# Patient Record
Sex: Female | Born: 1970
Health system: Southern US, Community
[De-identification: ages and names within clinical notes are randomized; demographics above are authoritative.]

## PROBLEM LIST (undated history)

## (undated) DIAGNOSIS — F32A Depression, unspecified: Secondary | ICD-10-CM

## (undated) DIAGNOSIS — N946 Dysmenorrhea, unspecified: Secondary | ICD-10-CM

## (undated) DIAGNOSIS — N979 Female infertility, unspecified: Secondary | ICD-10-CM

## (undated) DIAGNOSIS — C801 Malignant (primary) neoplasm, unspecified: Secondary | ICD-10-CM

## (undated) DIAGNOSIS — E785 Hyperlipidemia, unspecified: Secondary | ICD-10-CM

## (undated) DIAGNOSIS — M810 Age-related osteoporosis without current pathological fracture: Secondary | ICD-10-CM

## (undated) DIAGNOSIS — F329 Major depressive disorder, single episode, unspecified: Secondary | ICD-10-CM

## (undated) HISTORY — DX: Depression, unspecified: F32.A

## (undated) HISTORY — PX: TONSILLECTOMY: SUR1361

## (undated) HISTORY — DX: Major depressive disorder, single episode, unspecified: F32.9

## (undated) HISTORY — DX: Malignant (primary) neoplasm, unspecified: C80.1

## (undated) HISTORY — DX: Dysmenorrhea, unspecified: N94.6

## (undated) HISTORY — DX: Hyperlipidemia, unspecified: E78.5

## (undated) HISTORY — PX: FRACTURE SURGERY: SHX138

## (undated) HISTORY — DX: Female infertility, unspecified: N97.9

## (undated) HISTORY — DX: Age-related osteoporosis without current pathological fracture: M81.0

---
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
## 2004-07-28 ENCOUNTER — Other Ambulatory Visit: Admission: RE | Admit: 2004-07-28 | Discharge: 2004-07-28 | Payer: Self-pay | Admitting: Family Medicine

## 2006-01-28 ENCOUNTER — Emergency Department (HOSPITAL_COMMUNITY): Admission: EM | Admit: 2006-01-28 | Discharge: 2006-01-28 | Payer: Self-pay | Admitting: Emergency Medicine

## 2012-07-18 ENCOUNTER — Other Ambulatory Visit: Payer: Self-pay | Admitting: Obstetrics

## 2012-07-18 DIAGNOSIS — Z1231 Encounter for screening mammogram for malignant neoplasm of breast: Secondary | ICD-10-CM

## 2012-08-08 ENCOUNTER — Ambulatory Visit (HOSPITAL_COMMUNITY)
Admission: RE | Admit: 2012-08-08 | Discharge: 2012-08-08 | Disposition: A | Discharge status: Home / Self Care | Payer: BC Managed Care – PPO | Source: Ambulatory Visit | Attending: Obstetrics | Admitting: Obstetrics

## 2012-08-08 DIAGNOSIS — Z1231 Encounter for screening mammogram for malignant neoplasm of breast: Secondary | ICD-10-CM

## 2013-07-12 ENCOUNTER — Other Ambulatory Visit: Payer: Self-pay | Admitting: Obstetrics

## 2013-07-12 DIAGNOSIS — Z1231 Encounter for screening mammogram for malignant neoplasm of breast: Secondary | ICD-10-CM

## 2013-08-08 ENCOUNTER — Ambulatory Visit (HOSPITAL_COMMUNITY): Payer: BC Managed Care – PPO

## 2013-08-13 ENCOUNTER — Ambulatory Visit (HOSPITAL_COMMUNITY)
Admission: RE | Admit: 2013-08-13 | Discharge: 2013-08-13 | Disposition: A | Discharge status: Home / Self Care | Payer: BC Managed Care – PPO | Source: Ambulatory Visit | Attending: Obstetrics | Admitting: Obstetrics

## 2013-08-13 DIAGNOSIS — Z1231 Encounter for screening mammogram for malignant neoplasm of breast: Secondary | ICD-10-CM | POA: Insufficient documentation

## 2014-07-22 ENCOUNTER — Other Ambulatory Visit: Payer: Self-pay | Admitting: Obstetrics

## 2014-07-22 DIAGNOSIS — Z1231 Encounter for screening mammogram for malignant neoplasm of breast: Secondary | ICD-10-CM

## 2014-08-15 ENCOUNTER — Ambulatory Visit (HOSPITAL_COMMUNITY)
Admission: RE | Admit: 2014-08-15 | Discharge: 2014-08-15 | Disposition: A | Discharge status: Home / Self Care | Payer: BC Managed Care – PPO | Source: Ambulatory Visit | Attending: Obstetrics | Admitting: Obstetrics

## 2014-08-15 DIAGNOSIS — Z1231 Encounter for screening mammogram for malignant neoplasm of breast: Secondary | ICD-10-CM | POA: Diagnosis not present

## 2015-03-19 ENCOUNTER — Telehealth: Payer: Self-pay | Admitting: Obstetrics

## 2015-03-19 NOTE — Telephone Encounter (Signed)
[id number] -  Patient returned call and appt time is fine for her. brm

## 2015-03-20 ENCOUNTER — Encounter: Payer: Self-pay | Admitting: Certified Nurse Midwife

## 2015-03-20 ENCOUNTER — Ambulatory Visit: Payer: Self-pay | Admitting: Certified Nurse Midwife

## 2015-03-20 ENCOUNTER — Ambulatory Visit (INDEPENDENT_AMBULATORY_CARE_PROVIDER_SITE_OTHER): Payer: BLUE CROSS/BLUE SHIELD | Admitting: Certified Nurse Midwife

## 2015-03-20 VITALS — BP 134/83 | HR 71 | Temp 99.0°F | Ht 61.0 in | Wt 172.0 lb

## 2015-03-20 DIAGNOSIS — Z0000 Encounter for general adult medical examination without abnormal findings: Secondary | ICD-10-CM

## 2015-03-20 DIAGNOSIS — N979 Female infertility, unspecified: Secondary | ICD-10-CM | POA: Diagnosis not present

## 2015-03-20 LAB — COMPREHENSIVE METABOLIC PANEL
ALT: 9 U/L (ref 0–35)
AST: 16 U/L (ref 0–37)
Albumin: 4 g/dL (ref 3.5–5.2)
Alkaline Phosphatase: 61 U/L (ref 39–117)
BILIRUBIN TOTAL: 0.5 mg/dL (ref 0.2–1.2)
BUN: 12 mg/dL (ref 6–23)
CALCIUM: 9.1 mg/dL (ref 8.4–10.5)
CO2: 24 meq/L (ref 19–32)
CREATININE: 0.64 mg/dL (ref 0.50–1.10)
Chloride: 101 mEq/L (ref 96–112)
Glucose, Bld: 78 mg/dL (ref 70–99)
POTASSIUM: 4.3 meq/L (ref 3.5–5.3)
Sodium: 138 mEq/L (ref 135–145)
Total Protein: 6.6 g/dL (ref 6.0–8.3)

## 2015-03-20 LAB — CBC WITH DIFFERENTIAL/PLATELET
Basophils Absolute: 0.1 10*3/uL (ref 0.0–0.1)
Basophils Relative: 1 % (ref 0–1)
EOS PCT: 2 % (ref 0–5)
Eosinophils Absolute: 0.2 10*3/uL (ref 0.0–0.7)
HEMATOCRIT: 41.6 % (ref 36.0–46.0)
HEMOGLOBIN: 13.8 g/dL (ref 12.0–15.0)
Lymphocytes Relative: 32 % (ref 12–46)
Lymphs Abs: 2.4 10*3/uL (ref 0.7–4.0)
MCH: 32.2 pg (ref 26.0–34.0)
MCHC: 33.2 g/dL (ref 30.0–36.0)
MCV: 97 fL (ref 78.0–100.0)
MONO ABS: 0.8 10*3/uL (ref 0.1–1.0)
MONOS PCT: 10 % (ref 3–12)
MPV: 10.5 fL (ref 8.6–12.4)
NEUTROS ABS: 4.1 10*3/uL (ref 1.7–7.7)
Neutrophils Relative %: 55 % (ref 43–77)
Platelets: 350 10*3/uL (ref 150–400)
RBC: 4.29 MIL/uL (ref 3.87–5.11)
RDW: 13.3 % (ref 11.5–15.5)
WBC: 7.5 10*3/uL (ref 4.0–10.5)

## 2015-03-20 LAB — TSH: TSH: 1.314 u[IU]/mL (ref 0.350–4.500)

## 2015-03-20 NOTE — Addendum Note (Signed)
Addended by: Ladona Ridgel on: 03/20/2015 12:16 PM   Modules accepted: Orders

## 2015-03-20 NOTE — Progress Notes (Signed)
Patient ID: Becky Jensen, female   DOB: Jan 25, 1971, 44 y.o.   MRN: [id number]   Subjective:    Becky Jensen is a 44 y.o. female who presents for evaluation of infertility and annual GYN exam. Patient and partner have been attempting conception for 2 years. Marital status: married for 3 years. Pregnancies with current partner: no.  Denies any problems with periods, except for small penny sized clots.    The information documented in the HPI was reviewed and verified.  Personal health questionnaire:  Is patient Becky Jensen, have a family history of breast and/or ovarian cancer: no Is there a family history of uterine cancer diagnosed at age < 29, gastrointestinal cancer, urinary tract cancer, family member who is a Field seismologist syndrome-associated carrier: no Is the patient overweight and hypertensive, family history of diabetes, personal history of gestational diabetes, preeclampsia or PCOS: no Is patient over 50, have PCOS,  family history of premature CHD under age 39, diabetes, smoke, have hypertension or peripheral artery disease:  no At any time, has a partner hit, kicked or otherwise hurt or frightened you?: no Over the past 2 weeks, have you felt down, depressed or hopeless?: no Over the past 2 weeks, have you felt little interest or pleasure in doing things?:no   Gynecologic History Patient's last menstrual period was 03/14/2015. Contraception: none Last Pap: unknown.  Last mammogram: 8/21/5. Results were: normal  Obstetric History OB History  No data available    Past Medical History  Diagnosis Date  . Hyperlipidemia     Past Surgical History  Procedure Laterality Date  . Tonsillectomy      No current outpatient prescriptions on file. No Known Allergies  History  Substance Use Topics  . Smoking status: Never Smoker   . Smokeless tobacco: Not on file  . Alcohol Use: Yes    Family History  Problem Relation Age of Onset  . Hyperlipidemia Mother   . Hypertension Father        Review of Systems  Constitutional: negative for fatigue and weight loss Respiratory: negative for cough and wheezing Cardiovascular: negative for chest pain, fatigue and palpitations Gastrointestinal: negative for abdominal pain and change in bowel habits Musculoskeletal:negative for myalgias Neurological: negative for gait problems and tremors Behavioral/Psych: negative for abusive relationship, depression Endocrine: negative for temperature intolerance   Genitourinary:negative for abnormal menstrual periods, genital lesions, hot flashes, sexual problems and vaginal discharge Integument/breast: negative for breast lump, breast tenderness, nipple discharge and skin lesion(s)    Menstrual and Endocrine History LMP Patient's last menstrual period was 03/14/2015.  Menarche 10  Shortest interval 27  Longest interval 28  days  Duration of flow 5 days  Heavy menses day #2  Clots yes  Intermenstrual bleeding no  Postcoital bleeding no  Dysmenorrhea no  Amenorrhea not applicable  Weight change reports recent weight gain of 15lbs  Hirsutism no  Balding no  Acne no  Galactorrhea no   Obstetrical History 1 abortion as a teenager  Gynecologic History Last PAP 03/20/15  Previous abdominal or pelvic surgery no  Pelvic pain no  Endometriosis no  Hot flashes no  DES exposure unknown  Abnormal Pap none according to paient  Cervix Cryo/cone no  Sexually transmitted diseases no  Pelvic inflammatory disease no   Infertility and Endocrine Studies Basal body temperature yes  Endo with biopsy no  Hysterosalpingogram no  Post-coital test no  Laparoscopy no  Hormonal studies no  Semen analysis yes  Other studies no  Medications none  Other therapies Not applicable  Insemination not applicable   Sexual History Frequency about every 2-3  times per days  Satisfied yes  Dyspareunia no  Use of lubricant unknown  Douching no  Number of lifetime sex partners 3    Contraception None  Family History Thyroid problems  unknown  Heart condition or high blood pressure  no  Blood clot or stroke  no  Diabetes  no  Cancer  no  Birth defects/inherited diseases  no  Infectious diseases (mumps, TB, rubella)  no  Other medical problems  increased cholesterol levels, not on medication.   Habits Cigarettes:    Wife -  no    Husband - no Alcohol:    Wife -  occasional    Husband - unknown Marijuana:   Wife - no   Husband - no  Past Medical History  Diagnosis Date  . Hyperlipidemia     Past Surgical History  Procedure Laterality Date  . Tonsillectomy      No current outpatient prescriptions on file. No Known Allergies   Review of Systems Constitutional: negative for loss of weight Genitourinary:negative for abnormal menstrual periods, genital lesions, sexual problems and vaginal discharge Integument/breast: negative for breast lump, breast tenderness and nipple discharge Behavioral/Psych: negative for abusive relationship, depression, sexual difficulty and stress Endocrine: negative   Objective:       BP 134/83 mmHg  Pulse 71  Temp(Src) 99 F (37.2 C)  Ht 5\' 1"  (1.549 m)  Wt 78.019 kg (172 lb)  BMI 32.52 kg/m2  LMP 03/14/2015 General:   alert  Skin:   no rash or abnormalities  Lungs:   clear to auscultation bilaterally  Heart:   regular rate and rhythm, S1, S2 normal, no murmur, click, rub or gallop  Breasts:   normal without suspicious masses, skin or nipple changes or axillary nodes  Abdomen:  normal findings: no organomegaly, soft, non-tender and no hernia  Pelvis:  External genitalia: normal general appearance Urinary system: urethral meatus normal and bladder without fullness, nontender Vaginal: normal without tenderness, induration or masses Cervix: normal appearance Adnexa: normal bimanual exam Uterus: anteverted and non-tender, normal size   Lab Review Urine pregnancy test Labs reviewed yes Radiologic studies  reviewed yes   Assessment:    Healthy female exam.    Primary infertility due to unknown factors ?age.   Plan:    Further management will depend upon the results of the above tests/procedures. All questions answered.   Education reviewed: calcium supplements, low fat, low cholesterol diet, self breast exams and weight bearing exercise. Mammogram ordered.   No orders of the defined types were placed in this encounter.   Orders Placed This Encounter  Procedures  . SureSwab Bacterial Vaginosis/itis  . US Transvaginal Non-OB    Standing Status: Future     Number of Occurrences:      Standing Expiration Date: 05/19/2016    Order Specific Question:  Reason for Exam (SYMPTOM  OR DIAGNOSIS REQUIRED)    Answer:  Infertility    Order Specific Question:  Preferred imaging location?    Answer:  [facility]  . MM DIGITAL SCREENING BILATERAL    Standing Status: Future     Number of Occurrences:      Standing Expiration Date: 05/19/2016    Order Specific Question:  Reason for Exam (SYMPTOM  OR DIAGNOSIS REQUIRED)    Answer:  z12.4    Order Specific Question:  Is the patient pregnant?    Answer:  No  Order Specific Question:  Preferred imaging location?    Answer:  [facility]  . TSH  . Prolactin  . Hepatitis B surface antigen  . RPR  . Hepatitis C antibody  . Comprehensive metabolic panel  . CBC with Differential/Platelet  . HIV antibody (with reflex)   Need to obtain previous records Possible management options include: is scheduled with Dr. Rolin Barry Reproductive/Endocrinologist in April for further evaluation of Infertility Follow up as needed.

## 2015-03-20 NOTE — Patient Instructions (Signed)
Infertility WHAT IS INFERTILITY?  Infertility is usually defined as not being able to get pregnant after trying for one year of regular sexual intercourse without the use of contraceptives. Or not being able to carry a pregnancy to term and have a baby. The infertility rate in the [place] is around 10%. Pregnancy is the result of a chain of events. A woman must release an egg from one of her ovaries (ovulation). The egg must be fertilized by the female sperm. Then it travels through a fallopian tube into the uterus (womb), where it attaches to the wall of the uterus and grows. A man must have enough sperm, and the sperm must join with (fertilize) the egg along the [place], at the proper time. The fertilized egg must then become attached to the inside of the uterus. While this may seem simple, many things can happen to prevent pregnancy from occurring.  WHOSE PROBLEM IS IT?  About 20% of infertility cases are due to problems with the man (female factors) and 65% are due to problems with the woman (female factors). Other cases are due to a combination of female and female factors or to unknown causes.  WHAT CAUSES INFERTILITY IN MEN?  Infertility in men is often caused by problems with making enough normal sperm or getting the sperm to reach the egg. Problems with sperm may exist from birth or develop later in life, due to illness or injury. Some men produce no sperm, or produce too few sperm (oligospermia). Other problems include:  Sexual dysfunction.  Hormonal or endocrine problems.  Age. Female fertility decreases with age, but not at as young an age as female fertility.  Infection.  Congenital problems. Birth defect, such as absence of the tubes that carry the sperm (vas deferens).  Genetic/chromosomal problems.  Antisperm antibody problems.  Retrograde ejaculation (sperm go into the bladder).  Varicoceles, spematoceles, or tumors of the testicles.  Lifestyle can influence the number and  quality of a man's sperm.  Alcohol and drugs can temporarily reduce sperm quality.  Environmental toxins, including pesticides and lead, may cause some cases of infertility in men. WHAT CAUSES INFERTILITY IN WOMEN?   Problems with ovulation account for most infertility in women. Without ovulation, eggs are not available to be fertilized.  Signs of problems with ovulation include irregular menstrual periods or no periods at all.  Simple lifestyle factors, including stress, diet, or athletic training, can affect a woman's hormonal balance.  Age. Fertility begins to decrease in women in the early 76s and is worse after age 71.  Much less often, a hormonal imbalance from a serious medical problem, such as a pituitary gland tumor, thyroid or other chronic medical disease, can cause ovulation problems.  Pelvic infections.  Polycystic ovary syndrome (increase in female hormones, unable to ovulate).  Alcohol or illegal drugs.  Environmental toxins, radiation, pesticides, and certain chemicals.  Aging is an important factor in female infertility.  The ability of a woman's ovaries to produce eggs declines with age, especially after age 27. About one third of couples where the woman is over 21 will have problems with fertility.  By the time she reaches menopause when her monthly periods stop for good, a woman can no longer produce eggs or become pregnant.  Other problems can also lead to infertility in women. If the fallopian tubes are blocked at one or both ends, the egg cannot travel through the tubes into the uterus. Scar tissue (adhesions) in the pelvis may cause blocked  tubes. This may result from pelvic inflammatory disease, endometriosis, or surgery for an ectopic pregnancy (fertilized egg implanted outside the uterus) or any pelvic or abdominal surgery causing adhesions.  Fibroid tumors or polyps of the uterus.  Congenital (birth defect) abnormalities of the uterus.  Infection of the  cervix (cervicitis).  Cervical stenosis (narrowing).  Abnormal cervical mucus.  Polycystic ovary syndrome.  Having sexual intercourse too often (every other day or 4 to 5 times a week).  Obesity.  Anorexia.  Poor nutrition.  Over exercising, with loss of body fat.  DES. Your mother received diethylstilbesterol hormone when pregnant with you. HOW IS INFERTILITY TESTED?  If you have been trying to have a baby without success, you may want to seek medical help. You should not wait for one year of trying before seeing a health care provider if:  You are over 35.  You have reason to believe that there may be a fertility problem. A medical evaluation may determine the reasons for a couple's infertility. Usually this process begins with:  Physical exams.  Medical histories of both partners.  Sexual histories of both partners. If there is no obvious problem, like improperly timed intercourse or absence of ovulation, tests may be needed.   For a man, testing usually begins with tests of his semen to look at:  The number of sperm.  The shape of sperm.  Movement of his sperm.  Taking a complete medical and surgical history.  Physical examination.  Check for infection of the female reproductive organs. Sometimes hormone tests are done.   For a woman, the first step in testing is to find out if she is ovulating each month. There are several ways to do this. For example, she can keep track of changes in her morning body temperature and in the texture of her cervical mucus. Another tool is a home ovulation test kit, which can be bought at drug or grocery stores.  Checks of ovulation can also be done in the doctor's office, using blood tests for hormone levels or ultrasound tests of the ovaries. If the woman is ovulating, more tests will need to be done. Some common female tests include:  Hysterosalpingogram: An x-ray of the fallopian tubes and uterus after they are injected with  dye. It shows if the tubes are open and shows the shape of the uterus.  Laparoscopy: An exam of the tubes and other female organs for disease. A lighted tube called a laparoscope is used to see inside the abdomen.  Endometrial biopsy: Sample of uterus tissue taken on the first day of the menstrual period, to see if the tissue indicates you are ovulating.  Transvaginal ultrasound: Examines the female organs.  Hysteroscopy: Uses a lighted tube to examine the cervix and inside the uterus, to see if there are any abnormalities inside the uterus. TREATMENT  Depending on the test results, different treatments can be suggested. The type of treatment depends on the cause. 85 to 90% of infertility cases are treated with drugs or surgery.   Various fertility drugs may be used for women with ovulation problems. It is important to talk with your caregiver about the drug to be used. You should understand the drug's benefits and side effects. Depending on the type of fertility drug and the dosage of the drug used, multiple births (twins or multiples) can occur in some women.  If needed, surgery can be done to repair damage to a woman's ovaries, fallopian tubes, cervix, or uterus.  Surgery  or medical treatment for endometriosis or polycystic ovary syndrome. Sometimes a man has an infertility problem that can be corrected with medicine or by surgery.  Intrauterine insemination (IUI) of sperm, timed with ovulation.  Change in lifestyle, if that is the cause (lose weight, increase exercise, and stop smoking, drinking excessively, or taking illegal drugs).  Other types of surgery:  Removing growths inside and on the uterus.  Removing scar tissue from inside of the uterus.  Fixing blocked tubes.  Removing scar tissue in the pelvis and around the female organs. WHAT IS ASSISTED REPRODUCTIVE TECHNOLOGY (ART)?  Assisted reproductive technology (ART) is another form of special methods used to help infertile  couples. ART involves handling both the woman's eggs and the man's sperm. Success rates vary and depend on many factors. ART can be expensive and time-consuming. But ART has made it possible for many couples to have children that otherwise would not have been conceived. Some methods are listed below:  In vitro fertilization (IVF). IVF is often used when a woman's fallopian tubes are blocked or when a man has low sperm counts. A drug is used to stimulate the ovaries to produce multiple eggs. Once mature, the eggs are removed and placed in a culture dish with the man's sperm for fertilization. After about 40 hours, the eggs are examined to see if they have become fertilized by the sperm and are dividing into cells. These fertilized eggs (embryos) are then placed in the woman's uterus. This bypasses the fallopian tubes.  Gamete intrafallopian transfer (GIFT) is similar to IVF, but used when the woman has at least one normal fallopian tube. Three to five eggs are placed in the fallopian tube, along with the man's sperm, for fertilization inside the woman's body.  Zygote intrafallopian transfer (ZIFT), also called tubal embryo transfer, combines IVF and GIFT. The eggs retrieved from the woman's ovaries are fertilized in the lab and placed in the fallopian tubes rather than in the uterus.  ART procedures sometimes involve the use of donor eggs (eggs from another woman) or previously frozen embryos. Donor eggs may be used if a woman has impaired ovaries or has a genetic disease that could be passed on to her baby.  When performing ART, you are at higher risk for resulting in multiple pregnancies, twins, triplets or more.  Intracytoplasma sperm injection is a procedure that injects a single sperm into the egg to fertilize it.  Embryo transplant is a procedure that starts after growing an embryo in a special media (chemical solution) developed to keep the embryo alive for 2 to 5 days, and then transplanting it  into the uterus. In cases where a cause cannot be found and pregnancy does not occur, adoption may be a consideration. Document Released: 12/15/2003 Document Revised: 03/05/2012 Document Reviewed: 11/10/2009 [facility] Patient Information 2015 [place], [place]. This information is not intended to replace advice given to you by your health care provider. Make sure you discuss any questions you have with your health care provider.

## 2015-03-21 LAB — HEPATITIS C ANTIBODY: HCV AB: NEGATIVE

## 2015-03-21 LAB — HEPATITIS B SURFACE ANTIGEN: HEP B S AG: NEGATIVE

## 2015-03-21 LAB — PROLACTIN: PROLACTIN: 8.3 ng/mL

## 2015-03-21 LAB — RPR

## 2015-03-21 LAB — HIV ANTIBODY (ROUTINE TESTING W REFLEX): HIV: NONREACTIVE

## 2015-03-23 LAB — SURESWAB BACTERIAL VAGINOSIS/ITIS
Atopobium vaginae: NOT DETECTED Log (cells/mL)
C. albicans, DNA: NOT DETECTED
C. glabrata, DNA: NOT DETECTED
C. parapsilosis, DNA: NOT DETECTED
C. tropicalis, DNA: NOT DETECTED
Gardnerella vaginalis: NOT DETECTED Log (cells/mL)
LACTOBACILLUS SPECIES: NOT DETECTED Log (cells/mL)
MEGASPHAERA SPECIES: NOT DETECTED Log (cells/mL)
T. vaginalis RNA, QL TMA: NOT DETECTED

## 2015-03-24 ENCOUNTER — Telehealth: Payer: Self-pay

## 2015-03-24 NOTE — Telephone Encounter (Signed)
patient's last mammogram was 08/15/14 - sch her for 08/24/15 at 3:20pm amd u/s here at [facility] on 6/6 at 11am - told her to call us back and confirm

## 2015-03-25 ENCOUNTER — Other Ambulatory Visit: Payer: Self-pay | Admitting: Certified Nurse Midwife

## 2015-03-25 DIAGNOSIS — N979 Female infertility, unspecified: Secondary | ICD-10-CM

## 2015-03-25 LAB — PAP IG AND HPV HIGH-RISK: HPV DNA High Risk: NOT DETECTED

## 2015-04-01 ENCOUNTER — Other Ambulatory Visit: Payer: BLUE CROSS/BLUE SHIELD

## 2015-04-01 ENCOUNTER — Ambulatory Visit (INDEPENDENT_AMBULATORY_CARE_PROVIDER_SITE_OTHER): Payer: BLUE CROSS/BLUE SHIELD

## 2015-04-01 DIAGNOSIS — N979 Female infertility, unspecified: Secondary | ICD-10-CM

## 2015-04-06 DIAGNOSIS — Z029 Encounter for administrative examinations, unspecified: Secondary | ICD-10-CM

## 2015-04-07 ENCOUNTER — Telehealth: Payer: Self-pay

## 2015-04-07 NOTE — Telephone Encounter (Signed)
Faxed patient's medical records to [facility] as requested [phone] let patient know - most of it was in [place], so had to manually fax

## 2015-08-25 ENCOUNTER — Ambulatory Visit
Admission: RE | Admit: 2015-08-25 | Discharge: 2015-08-25 | Disposition: A | Discharge status: Home / Self Care | Payer: BLUE CROSS/BLUE SHIELD | Source: Ambulatory Visit | Attending: Certified Nurse Midwife | Admitting: Certified Nurse Midwife

## 2015-08-25 DIAGNOSIS — Z0000 Encounter for general adult medical examination without abnormal findings: Secondary | ICD-10-CM

## 2017-03-08 ENCOUNTER — Ambulatory Visit (INDEPENDENT_AMBULATORY_CARE_PROVIDER_SITE_OTHER): Payer: Commercial Managed Care - PPO | Admitting: Physician Assistant

## 2017-03-08 VITALS — BP 144/88 | HR 120 | Temp 98.9°F | Resp 18 | Ht 61.0 in | Wt 183.0 lb

## 2017-03-08 DIAGNOSIS — Z23 Encounter for immunization: Secondary | ICD-10-CM | POA: Diagnosis not present

## 2017-03-08 DIAGNOSIS — F321 Major depressive disorder, single episode, moderate: Secondary | ICD-10-CM | POA: Diagnosis not present

## 2017-03-08 MED ORDER — FLUOXETINE HCL 20 MG PO TABS: 20.0000 mg | ORAL_TABLET | Freq: Every day | ORAL | 3 refills | Status: DC

## 2017-03-08 NOTE — Patient Instructions (Addendum)
It was good to meet you today Becky Jensen.  If you have any problems before the scheduled follow up please come in sooner.    IF you received an x-ray today, you will receive an invoice from [facility]. Please contact [facility] at [phone] with questions or concerns regarding your invoice.   IF you received labwork today, you will receive an invoice from [facility] [facility]. Please contact [facility] at [phone] with questions or concerns regarding your invoice.   Our billing staff will not be able to assist you with questions regarding bills from these companies.  You will be contacted with the lab results as soon as they are available. The fastest [place] to get your results is to activate your My Chart account. Instructions are located on the last page of this paperwork. If you have not heard from [place] regarding the results in 2 weeks, please contact this office.

## 2017-03-08 NOTE — Progress Notes (Signed)
03/08/2017 2:51 PM   DOB: 04/12/1971 / MRN: [id number]  SUBJECTIVE:  Becky Jensen is a 46 y.o. female presenting for anhedonia.  This started in Nov. 17 about one month after multiple failed fertility treatments.  She associates excessive tearfulness, excessive eating and sleeping, and drinks two to three high gravity beers nightly and she is concerned about this.  She feels that he is getting worse.  She denies suicidal thought at this time. She is safe at home with her husband whom she describes as lase-faire but not depressed. 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 She has an appointment scheduled with Becky Jensen, a local psychologist whom she has seen before for a depression in 2009.  Tells me she took prozac for six months with excellent results and no side effects.   Depression screen PHQ 2/9 03/08/2017  Decreased Interest 1  Down, Depressed, Hopeless 2  PHQ - 2 Score 3  Altered sleeping 3  Tired, decreased energy 3  Change in appetite 1  Feeling bad or failure about yourself  3  Trouble concentrating 3  Moving slowly or fidgety/restless 1  Suicidal thoughts 0  PHQ-9 Score 17     She has No Known Allergies.   She  has a past medical history of Depression and Hyperlipidemia.    She  reports that she has never smoked. She has never used smokeless tobacco. She reports that she drinks alcohol. She reports that she does not use drugs. She  reports that she currently engages in sexual activity. She reports using the following method of birth control/protection: None. The patient  has a past surgical history that includes Tonsillectomy.  Her family history includes Cancer in her maternal grandmother and paternal grandmother; Heart disease in her maternal grandfather; Hyperlipidemia in her mother;  Hypertension in her father and paternal grandfather; Stroke in her paternal grandfather.  Review of Systems  Cardiovascular: Negative for chest pain, palpitations, orthopnea and leg swelling.  Neurological: Negative for dizziness and headaches.  Psychiatric/Behavioral: Negative for depression.    The problem list and medications were reviewed and updated by myself where necessary and exist elsewhere in the encounter.   OBJECTIVE:  BP (!) 144/88   Pulse (!) 120   Temp 98.9 F (37.2 C) (Oral)   Resp 18   Ht 5\' 1"  (1.549 m)   Wt 183 lb (83 kg)   LMP 03/01/2017   SpO2 97%   BMI 34.58 kg/m   Physical Exam  Constitutional: She is oriented to person, place, and time.  Cardiovascular: Regular rhythm and normal heart sounds.   Pulmonary/Chest: Effort normal and breath sounds normal.  Musculoskeletal: Normal range of motion.  Neurological: She is alert and oriented to person, place, and time.  Skin: Skin is warm and dry.  Psychiatric: Her mood appears anxious. Her affect is not angry, not blunt, not labile and not inappropriate. Her speech is not rapid and/or pressured, not delayed, not tangential and not slurred. She is withdrawn. She is not agitated, not aggressive, not hyperactive, not slowed, not actively hallucinating and not combative. Thought content is not paranoid and not delusional. Cognition and memory are not impaired. She does not express impulsivity or inappropriate judgment. She exhibits a depressed mood. She expresses no homicidal and no suicidal ideation. She expresses no suicidal plans and no  homicidal plans. She is communicative. She exhibits normal recent memory and normal remote memory. She is attentive.  Vitals reviewed.   Lab Results  Component Value Date   TSH 1.314 03/20/2015   Lab Results  Component Value Date   WBC 7.5 03/20/2015   HGB 13.8 03/20/2015   HCT 41.6 03/20/2015   MCV 97.0 03/20/2015   PLT 350 03/20/2015   Lab Results  Component Value Date    CREATININE 0.64 03/20/2015   BUN 12 03/20/2015   NA 138 03/20/2015   K 4.3 03/20/2015   CL 101 03/20/2015   CO2 24 03/20/2015     No results found for this or any previous visit (from the past 72 hour(s)).  No results found.  ASSESSMENT AND PLAN:  [name] was seen today for depression.  Diagnoses and all orders for this visit:  Moderate major depression (Talent): Advised that she consume no more than 1 beer nightly.  Will start her back on prozac and she will attend counseling.  Advised she try to get outside and get some physical activity. She will come back in 4-5 weeks for a recheck and possible medication titration.  -     FLUoxetine (PROZAC) 20 MG tablet; Take 1 tablet (20 mg total) by mouth daily.  Need for prophylactic vaccination and inoculation against influenza -     Flu Vaccine QUAD 36+ mos IM    The patient is advised to call or return to clinic if she does not see an improvement in symptoms, or to seek the care of the closest emergency department if she worsens with the above plan.   Becky Jensen, MHS, PA-C [facility] 03/08/2017 2:51 PM

## 2017-06-26 ENCOUNTER — Other Ambulatory Visit: Payer: Self-pay | Admitting: Physician Assistant

## 2017-06-26 DIAGNOSIS — F321 Major depressive disorder, single episode, moderate: Secondary | ICD-10-CM

## 2017-06-29 ENCOUNTER — Telehealth: Payer: Self-pay | Admitting: Family Medicine

## 2017-06-29 NOTE — Telephone Encounter (Signed)
LMOM TO CALL BACK TO SCHEDULE AN OV FOR MED REFILL

## 2017-07-05 ENCOUNTER — Ambulatory Visit (INDEPENDENT_AMBULATORY_CARE_PROVIDER_SITE_OTHER): Payer: Commercial Managed Care - PPO | Admitting: Physician Assistant

## 2017-07-05 ENCOUNTER — Encounter: Payer: Self-pay | Admitting: Physician Assistant

## 2017-07-05 VITALS — BP 153/97 | HR 78 | Temp 98.6°F | Resp 16 | Ht 61.0 in | Wt 181.0 lb

## 2017-07-05 DIAGNOSIS — F411 Generalized anxiety disorder: Secondary | ICD-10-CM

## 2017-07-05 DIAGNOSIS — F32A Depression, unspecified: Secondary | ICD-10-CM

## 2017-07-05 DIAGNOSIS — F329 Major depressive disorder, single episode, unspecified: Secondary | ICD-10-CM

## 2017-07-05 MED ORDER — FLUOXETINE HCL 40 MG PO CAPS: 40.0000 mg | ORAL_CAPSULE | Freq: Every day | ORAL | 1 refills | Status: DC

## 2017-07-05 NOTE — Patient Instructions (Addendum)
It appears that you are doing great with the medication and counseling. We can try the increase in prozac to see if you gain any further benefit from the medication. Start taking prozac 40mg  daily. Continue therapy. Follow up with me or PA Clark in 3 months for further evaluation. Thank you for letting me participate in your health and well being.    IF you received an x-ray today, you will receive an invoice from [facility]. Please contact [facility] at [phone] with questions or concerns regarding your invoice.   IF you received labwork today, you will receive an invoice from [place]. Please contact [facility] at [phone] with questions or concerns regarding your invoice.   Our billing staff will not be able to assist you with questions regarding bills from these companies.  You will be contacted with the lab results as soon as they are available. The fastest [place] to get your results is to activate your My Chart account. Instructions are located on the last page of this paperwork. If you have not heard from [place] regarding the results in 2 weeks, please contact this office.

## 2017-07-05 NOTE — Progress Notes (Signed)
Legacy Carrender  MRN: [id number] DOB: 09-28-1971  Subjective:  Becky Jensen is a 46 y.o. female seen in office today for a chief complaint of follow up on anxiety. Pt initally seen by PA Clark in 02/2017 for anhedonia. She had failed multiple fertily tx. Was statred on prozac 20mg , instructed to go to counseling, and return in 5 weeks for reevalutuion. Today, she reports she almost feels back to herself completely. She states she is crying less and sleeping better. She is seeing psychologist, Burnard Leigh, twice a month and notes this is working out great for her. She is tolerating the medication well. Denies nausea, vomiting, insomnia, and weight changes. 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 She does endorse still comfort eating when she is has moments of saddened mood. She also notes some moments of anxiety but notes these episodes are minimal and also much improved since starting the counseling and medication. Denies suicidal thoughts or ideations. Has no other questions or concerns.   Review of Systems Per HPI Patient Active Problem List   Diagnosis Date Noted  . Infertility, female 03/20/2015    Current Outpatient Prescriptions on File Prior to Visit  Medication Sig Dispense Refill  . FLUoxetine (PROZAC) 20 MG tablet Take 1 tablet (20 mg total) by mouth daily. 30 tablet 3   No current facility-administered medications on file prior to visit.     No Known Allergies   Objective:  BP (!) 153/97   Pulse 78   Temp 98.6 F (37 C) (Oral)   Resp 16   Ht 5\' 1"  (1.549 m)   Wt 181 lb (82.1 kg)   SpO2 97%   BMI 34.20 kg/m   Physical Exam  Constitutional: She is oriented to person, place, and time and well-developed, well-nourished, and in no distress.  HENT:  Head: Normocephalic and atraumatic.  Eyes: Conjunctivae are normal.  Neck: Normal range of motion.  Cardiovascular:  Normal rate, regular rhythm and normal heart sounds.   Pulmonary/Chest: Effort normal.  Neurological: She is alert and oriented to person, place, and time. Gait normal.  Skin: Skin is warm and dry.  Psychiatric: Affect normal.  Vitals reviewed.    Depression screen [facility] 2/9 07/05/2017 07/05/2017 03/08/2017  Decreased Interest 1 0 1  Down, Depressed, Hopeless 1 0 2  PHQ - 2 Score 2 0 3  Altered sleeping 0 - 3  Tired, decreased energy 1 - 3  Change in appetite 3 - 1  Feeling bad or failure about yourself  1 - 3  Trouble concentrating 1 - 3  Moving slowly or fidgety/restless 0 - 1  Suicidal thoughts 0 - 0  PHQ-9 Score 8 - 17    GAD 7 : Generalized Anxiety Score 07/05/2017  Nervous, Anxious, on Edge 1  Control/stop worrying 2  Worry too much - different things 1  Trouble relaxing 0  Restless 1  Easily annoyed or irritable 2  Afraid - awful might happen 1  Total GAD 7 Score 8  Anxiety Difficulty Not difficult at all     Wt Readings from Last 3 Encounters:  07/05/17 181 lb (82.1 kg)  03/08/17 183 lb (83 kg)  03/20/15 172 lb (78 kg)    Assessment and Plan :  1. Depression, unspecified depression type 2. Anxiety state Pt appears to be responding well to prozac. Will increase to  40mg  at this time to see if pt gains any more benefit from the medication. Encouraged to continue counseling. Return for follow up in 3 months for reevaluation. Will repeat PHQ9 and GAD7 at that visit.  - FLUoxetine (PROZAC) 40 MG capsule; Take 1 capsule (40 mg total) by mouth daily.  Dispense: 90 capsule; Refill: 1  A total of 25 was spent in the room with the patient, greater than 50% of which was in counseling/coordination of care regarding depression and anxiety.  Tenna Delaine, PA-C  [facility] 07/06/2017 4:36 PM

## 2017-07-10 DIAGNOSIS — H524 Presbyopia: Secondary | ICD-10-CM | POA: Diagnosis not present

## 2017-07-13 ENCOUNTER — Other Ambulatory Visit: Payer: Self-pay | Admitting: Obstetrics

## 2017-07-13 DIAGNOSIS — Z1231 Encounter for screening mammogram for malignant neoplasm of breast: Secondary | ICD-10-CM

## 2017-07-25 ENCOUNTER — Ambulatory Visit
Admission: RE | Admit: 2017-07-25 | Discharge: 2017-07-25 | Disposition: A | Discharge status: Home / Self Care | Payer: BLUE CROSS/BLUE SHIELD | Source: Ambulatory Visit | Attending: Obstetrics | Admitting: Obstetrics

## 2017-07-25 DIAGNOSIS — Z1231 Encounter for screening mammogram for malignant neoplasm of breast: Secondary | ICD-10-CM

## 2017-08-03 ENCOUNTER — Ambulatory Visit (INDEPENDENT_AMBULATORY_CARE_PROVIDER_SITE_OTHER): Payer: Commercial Managed Care - PPO | Admitting: Obstetrics

## 2017-08-03 ENCOUNTER — Encounter: Payer: Self-pay | Admitting: Obstetrics

## 2017-08-03 VITALS — BP 130/86 | HR 88 | Ht 62.0 in | Wt 183.0 lb

## 2017-08-03 DIAGNOSIS — Z01419 Encounter for gynecological examination (general) (routine) without abnormal findings: Secondary | ICD-10-CM | POA: Diagnosis not present

## 2017-08-03 DIAGNOSIS — Z124 Encounter for screening for malignant neoplasm of cervix: Secondary | ICD-10-CM

## 2017-08-03 DIAGNOSIS — Z1151 Encounter for screening for human papillomavirus (HPV): Secondary | ICD-10-CM

## 2017-08-03 NOTE — Progress Notes (Signed)
Patient is in the office for annual exam, last pap 03-20-15. Pt declined std testing.

## 2017-08-03 NOTE — Progress Notes (Signed)
Subjective:        Becky Jensen is a 46 y.o. female here for a routine exam.  Current complaints: None.    Personal health questionnaire:  Is patient Ashkenazi Jewish, have a family history of breast and/or ovarian cancer: no Is there a family history of uterine cancer diagnosed at age < 7, gastrointestinal cancer, urinary tract cancer, family member who is a Field seismologist syndrome-associated carrier: no Is the patient overweight and hypertensive, family history of diabetes, personal history of gestational diabetes, preeclampsia or PCOS: no Is patient over 15, have PCOS,  family history of premature CHD under age 82, diabetes, smoke, have hypertension or peripheral artery disease:  no At any time, has a partner hit, kicked or otherwise hurt or frightened you?: no Over the past 2 weeks, have you felt down, depressed or hopeless?: no Over the past 2 weeks, have you felt little interest or pleasure in doing things?:no   Gynecologic History Patient's last menstrual period was 07/27/2017. Contraception: none Last Pap: 2016. Results were: normal Last mammogram: 2018. Results were: normal  Obstetric History OB History  Gravida Para Term Preterm AB Living  1 0 0 0 1 0  SAB TAB Ectopic Multiple Live Births  1 0 0 0      # Outcome Date GA Lbr Len/2nd Weight Sex Delivery Anes PTL Lv  1 SAB               Past Medical History:  Diagnosis Date  . Depression   . Hyperlipidemia     Past Surgical History:  Procedure Laterality Date  . TONSILLECTOMY       Current Outpatient Prescriptions:  .  FLUoxetine (PROZAC) 40 MG capsule, Take 1 capsule (40 mg total) by mouth daily., Disp: 90 capsule, Rfl: 1 No Known Allergies  Social History  Substance Use Topics  . Smoking status: Never Smoker  . Smokeless tobacco: Never Used  . Alcohol use Yes    Family History  Problem Relation Age of Onset  . Hyperlipidemia Mother   . Hypertension Father   . Cancer Maternal Grandmother   . Heart disease  Maternal Grandfather   . Cancer Paternal Grandmother   . Hypertension Paternal Grandfather   . Stroke Paternal Grandfather   . Breast cancer Maternal Aunt   . Breast cancer Cousin       Review of Systems  Constitutional: negative for fatigue and weight loss Respiratory: negative for cough and wheezing Cardiovascular: negative for chest pain, fatigue and palpitations Gastrointestinal: negative for abdominal pain and change in bowel habits Musculoskeletal:negative for myalgias Neurological: negative for gait problems and tremors Behavioral/Psych: negative for abusive relationship, depression Endocrine: negative for temperature intolerance    Genitourinary:negative for abnormal menstrual periods, genital lesions, hot flashes, sexual problems and vaginal discharge Integument/breast: negative for breast lump, breast tenderness, nipple discharge and skin lesion(s)    Objective:       BP 130/86   Pulse 88   Ht 5\' 2"  (1.575 m)   Wt 183 lb (83 kg)   LMP 07/27/2017   BMI 33.47 kg/m  General:   alert  Skin:   no rash or abnormalities  Lungs:   clear to auscultation bilaterally  Heart:   regular rate and rhythm, S1, S2 normal, no murmur, click, rub or gallop  Breasts:   normal without suspicious masses, skin or nipple changes or axillary nodes  Abdomen:  normal findings: no organomegaly, soft, non-tender and no hernia  Pelvis:  External genitalia: normal  general appearance Urinary system: urethral meatus normal and bladder without fullness, nontender Vaginal: normal without tenderness, induration or masses Cervix: normal appearance Adnexa: normal bimanual exam Uterus: anteverted and non-tender, normal size   Lab Review Urine pregnancy test Labs reviewed yes Radiologic studies reviewed yes  50% of 20 min visit spent on counseling and coordination of care.    Assessment:     1. Encounter for routine gynecological examination with Papanicolaou smear of cervix Rx: - Cytology  - PAP - Cervicovaginal ancillary only   Plan:    Education reviewed: calcium supplements, depression evaluation, low fat, low cholesterol diet, safe sex/STD prevention, self breast exams and weight bearing exercise. Follow up in: 1 year.   No orders of the defined types were placed in this encounter.  No orders of the defined types were placed in this encounter.

## 2017-08-07 LAB — CERVICOVAGINAL ANCILLARY ONLY
BACTERIAL VAGINITIS: NEGATIVE
CANDIDA VAGINITIS: NEGATIVE

## 2017-08-07 LAB — CYTOLOGY - PAP
Adequacy: ABSENT
DIAGNOSIS: NEGATIVE
HPV: NOT DETECTED

## 2017-09-28 DIAGNOSIS — Z23 Encounter for immunization: Secondary | ICD-10-CM | POA: Diagnosis not present

## 2018-02-09 ENCOUNTER — Other Ambulatory Visit: Payer: Self-pay | Admitting: Physician Assistant

## 2018-02-09 DIAGNOSIS — F32A Depression, unspecified: Secondary | ICD-10-CM

## 2018-02-09 DIAGNOSIS — F329 Major depressive disorder, single episode, unspecified: Secondary | ICD-10-CM

## 2018-02-09 NOTE — Telephone Encounter (Signed)
Prozac refill request Last OV 07/05/17 [facility]  #[id number] Becky Jensen, Becky Jensen

## 2018-06-11 ENCOUNTER — Encounter: Payer: Self-pay | Admitting: Physician Assistant

## 2018-06-12 ENCOUNTER — Ambulatory Visit: Payer: Commercial Managed Care - PPO | Admitting: Physician Assistant

## 2018-06-12 ENCOUNTER — Other Ambulatory Visit: Payer: Self-pay

## 2018-06-12 ENCOUNTER — Encounter: Payer: Self-pay | Admitting: Physician Assistant

## 2018-06-12 VITALS — BP 118/72 | HR 52 | Temp 98.0°F | Resp 16 | Ht 62.6 in | Wt 190.0 lb

## 2018-06-12 DIAGNOSIS — F32A Depression, unspecified: Secondary | ICD-10-CM

## 2018-06-12 DIAGNOSIS — F419 Anxiety disorder, unspecified: Secondary | ICD-10-CM

## 2018-06-12 DIAGNOSIS — F329 Major depressive disorder, single episode, unspecified: Secondary | ICD-10-CM

## 2018-06-12 MED ORDER — FLUOXETINE HCL 20 MG PO TABS: 20.0000 mg | ORAL_TABLET | Freq: Every day | ORAL | 0 refills | Status: DC

## 2018-06-12 NOTE — Progress Notes (Signed)
Daquana Paddock  MRN: [id number] DOB: 11/07/1971  Subjective:  Becky Jensen is a 47 y.o. female seen in office today for a chief complaint of follow-up on anxiety and depression.  Patient was last seen by me in office on 07/05/2017.  Was on Prozac 40 mg at that time.  Was doing well.  Patient notes that she stopped taking her Prozac in 02/2018 because she thought she was doing well and could stop medication.  She did not experience any withdrawal symptoms.  Recently, she has noticed that her anxiety and depression have returned.  We thought her anxiety would stop after she finished school for the summer but it has persisted.  She is continuing to worrying about every little thing at this point having nothing to actually worry about.  She has trouble relaxing, irritability, trouble relaxing, dysphoric mood, sleep disturbance, overeating, feelings as if she has a failure, and little energy.  Typically feels as if her anxiety is a driving factor but currently feels as if the depression is.  Denies suicidal ideation, homicidal ideation, panic attacks, hallucinations, manic episodes, and euphoria.  She is attending counseling once every 2 weeks.  Her counselor has highly suggested that she restart medication.  Review of Systems  Per HPI  Patient Active Problem List   Diagnosis Date Noted  . Infertility, female 03/20/2015   Past Medical History:  Diagnosis Date  . Depression   . Hyperlipidemia     Current Outpatient Medications on File Prior to Visit  Medication Sig Dispense Refill  . FLUoxetine (PROZAC) 40 MG capsule Take 1 capsule (40 mg total) by mouth daily. 90 capsule 1   No current facility-administered medications on file prior to visit.     No Known Allergies   Objective:  BP 118/72   Pulse (!) 52   Temp 98 F (36.7 C) (Oral)   Resp 16   Ht 5' 2.6" (1.59 m)   Wt 190 lb (86.2 kg)   SpO2 97%   BMI 34.09 kg/m   Physical Exam  Constitutional: She is oriented to person, place, and  time. She appears well-developed and well-nourished.  HENT:  Head: Normocephalic and atraumatic.  Eyes: Conjunctivae are normal.  Neck: Normal range of motion.  Pulmonary/Chest: Effort normal.  Neurological: She is alert and oriented to person, place, and time.  Skin: Skin is warm and dry.  Psychiatric: Her mood appears anxious.  Vitals reviewed.    Wt Readings from Last 3 Encounters:  06/12/18 190 lb (86.2 kg)  08/03/17 183 lb (83 kg)  07/05/17 181 lb (82.1 kg)   GAD 7 : Generalized Anxiety Score 06/12/2018 07/05/2017  Nervous, Anxious, on Edge 3 1  Control/stop worrying 2 2  Worry too much - different things 2 1  Trouble relaxing 3 0  Restless 0 1  Easily annoyed or irritable 2 2  Afraid - awful might happen 2 1  Total GAD 7 Score 14 8  Anxiety Difficulty - Not difficult at all    Depression screen [facility] 2/9 06/12/2018 06/12/2018 07/05/2017 07/05/2017 03/08/2017  Decreased Interest 1 0 1 0 1  Down, Depressed, Hopeless 2 0 1 0 2  PHQ - 2 Score 3 0 2 0 3  Altered sleeping 2 - 0 - 3  Tired, decreased energy 2 - 1 - 3  Change in appetite 3 - 3 - 1  Feeling bad or failure about yourself  3 - 1 - 3  Trouble concentrating 3 - 1 - 3  Moving slowly or fidgety/restless 0 - 0 - 1  Suicidal thoughts 0 - 0 - 0  PHQ-9 Score 16 - 8 - 17    Assessment and Plan :  1. Anxiety and depression Both anxiety and depression are under control at this time. She is not suicidal or homicidal.   Patient would likely benefit from both medication and counseling.  Plan to restart Prozac since she has had success with this in the past.  Start at 20 mg daily.  Educated on potential side effects of restarting SSRIs.  Encouraged to seek care immediately if she develops any worsening anxiety, depression, or new suicidal thoughts or plans.  Continue counseling as scheduled.  Follow-up in 4 weeks for reevaluation. - FLUoxetine (PROZAC) 20 MG tablet; Take 1 tablet (20 mg total) by mouth daily.  Dispense: 90 tablet;  Refill: 0   Side effects, risks, benefits, and alternatives of the medications and treatment plan prescribed today were discussed, and patient expressed understanding of the instructions given. No barriers to understanding were identified. Red flags discussed in detail. Pt expressed understanding regarding what to do in case of emergency/urgent symptoms.  A total of 25 minutes was spent in the room with the patient, greater than 50% of which was in counseling/coordination of care regarding anxiety and depression.  Tenna Delaine PA-C  [facility] 06/12/2018 12:59 PM

## 2018-06-12 NOTE — Patient Instructions (Addendum)
Come in for a complete physical exam in 4 weeks and we will reassess your anxiety/depression. Please keep in mind that when you start an SSRI it can worsen your depression and anxiety symptoms. It can also increase risk of suicidal thoughts so if this occurs, please seek care immediately. Common side effects of SSRIs include, but are not limited to, GI upset, nausea, vomiting, insomnia, and drowsiness. Typically these side effects will resolve after 2 weeks. Please keep in mind that it can take up to 4-6 weeks for SSRIs to be fully effective.     IF you received an x-ray today, you will receive an invoice from [facility]. Please contact [facility] at [phone] with questions or concerns regarding your invoice.   IF you received labwork today, you will receive an invoice from [facility]. Please contact [facility] at [phone] with questions or concerns regarding your invoice.   Our billing staff will not be able to assist you with questions regarding bills from these companies.  You will be contacted with the lab results as soon as they are available. The fastest [place] to get your results is to activate your My Chart account. Instructions are located on the last page of this paperwork. If you have not heard from [place] regarding the results in 2 weeks, please contact this office.

## 2018-06-14 ENCOUNTER — Encounter: Payer: Self-pay | Admitting: Physician Assistant

## 2018-07-10 ENCOUNTER — Other Ambulatory Visit: Payer: Self-pay | Admitting: Physician Assistant

## 2018-07-10 DIAGNOSIS — Z1231 Encounter for screening mammogram for malignant neoplasm of breast: Secondary | ICD-10-CM

## 2018-07-16 ENCOUNTER — Encounter: Payer: Self-pay | Admitting: Physician Assistant

## 2018-07-16 ENCOUNTER — Other Ambulatory Visit: Payer: Self-pay

## 2018-07-16 ENCOUNTER — Ambulatory Visit (INDEPENDENT_AMBULATORY_CARE_PROVIDER_SITE_OTHER): Payer: Commercial Managed Care - PPO | Admitting: Physician Assistant

## 2018-07-16 VITALS — BP 132/82 | HR 96 | Temp 98.7°F | Resp 20 | Ht 62.28 in | Wt 187.0 lb

## 2018-07-16 DIAGNOSIS — F329 Major depressive disorder, single episode, unspecified: Secondary | ICD-10-CM

## 2018-07-16 DIAGNOSIS — F32A Depression, unspecified: Secondary | ICD-10-CM

## 2018-07-16 DIAGNOSIS — Z130 Encounter for screening for diseases of the blood and blood-forming organs and certain disorders involving the immune mechanism: Secondary | ICD-10-CM | POA: Diagnosis not present

## 2018-07-16 DIAGNOSIS — Z13228 Encounter for screening for other metabolic disorders: Secondary | ICD-10-CM | POA: Diagnosis not present

## 2018-07-16 DIAGNOSIS — F419 Anxiety disorder, unspecified: Secondary | ICD-10-CM

## 2018-07-16 DIAGNOSIS — R7309 Other abnormal glucose: Secondary | ICD-10-CM | POA: Diagnosis not present

## 2018-07-16 DIAGNOSIS — Z0000 Encounter for general adult medical examination without abnormal findings: Secondary | ICD-10-CM

## 2018-07-16 DIAGNOSIS — X32XXXA Exposure to sunlight, initial encounter: Secondary | ICD-10-CM

## 2018-07-16 DIAGNOSIS — Z13220 Encounter for screening for lipoid disorders: Secondary | ICD-10-CM | POA: Diagnosis not present

## 2018-07-16 DIAGNOSIS — Z1389 Encounter for screening for other disorder: Secondary | ICD-10-CM

## 2018-07-16 DIAGNOSIS — Z23 Encounter for immunization: Secondary | ICD-10-CM | POA: Diagnosis not present

## 2018-07-16 DIAGNOSIS — M62830 Muscle spasm of back: Secondary | ICD-10-CM

## 2018-07-16 MED ORDER — FLUOXETINE HCL 40 MG PO CAPS: 40.0000 mg | ORAL_CAPSULE | Freq: Every day | ORAL | 1 refills | Status: DC

## 2018-07-16 MED ORDER — CYCLOBENZAPRINE HCL 5 MG PO TABS: 5.0000 mg | ORAL_TABLET | Freq: Three times a day (TID) | ORAL | 0 refills | Status: DC | PRN

## 2018-07-16 NOTE — Patient Instructions (Addendum)
It was a pleasure seeing you today.  You can increase your Prozac dose back to 40 mg daily.  If you are not having any effect with this before you start back to school, please make an appointment for reevaluation.  If everything is going well that is, please follow-up in 3 to 6 months.  Health Maintenance, Female Adopting a healthy lifestyle and getting preventive care can go a long [place] to promote health and wellness. Talk with your health care provider about what schedule of regular examinations is right for you. This is a good chance for you to check in with your provider about disease prevention and staying healthy. In between checkups, there are plenty of things you can do on your own. Experts have done a lot of research about which lifestyle changes and preventive measures are most likely to keep you healthy. Ask your health care provider for more information. Weight and diet Eat a healthy diet  Be sure to include plenty of vegetables, fruits, low-fat dairy products, and lean protein.  Do not eat a lot of foods high in solid fats, added sugars, or salt.  Get regular exercise. This is one of the most important things you can do for your health. ? Most adults should exercise for at least 150 minutes each week. The exercise should increase your heart rate and make you sweat (moderate-intensity exercise). ? Most adults should also do strengthening exercises at least twice a week. This is in addition to the moderate-intensity exercise.  Maintain a healthy weight  Body mass index (BMI) is a measurement that can be used to identify possible weight problems. It estimates body fat based on height and weight. Your health care provider can help determine your BMI and help you achieve or maintain a healthy weight.  For females 51 years of age and older: ? A BMI below 18.5 is considered underweight. ? A BMI of 18.5 to 24.9 is normal. ? A BMI of 25 to 29.9 is considered overweight. ? A BMI of 30 and  above is considered obese.  Watch levels of cholesterol and blood lipids  You should start having your blood tested for lipids and cholesterol at 47 years of age, then have this test every 5 years.  You may need to have your cholesterol levels checked more often if: ? Your lipid or cholesterol levels are high. ? You are older than 47 years of age. ? You are at high risk for heart disease.  Cancer screening Lung Cancer  Lung cancer screening is recommended for adults 38-22 years old who are at high risk for lung cancer because of a history of smoking.  A yearly low-dose CT scan of the lungs is recommended for people who: ? Currently smoke. ? Have quit within the past 15 years. ? Have at least a 30-pack-year history of smoking. A pack year is smoking an average of one pack of cigarettes a day for 1 year.  Yearly screening should continue until it has been 15 years since you quit.  Yearly screening should stop if you develop a health problem that would prevent you from having lung cancer treatment.  Breast Cancer  Practice breast self-awareness. This means understanding how your breasts normally appear and feel.  It also means doing regular breast self-exams. Let your health care provider know about any changes, no matter how small.  If you are in your 20s or 30s, you should have a clinical breast exam (CBE) by a health care provider  every 1-3 years as part of a regular health exam.  If you are 41 or older, have a CBE every year. Also consider having a breast X-ray (mammogram) every year.  If you have a family history of breast cancer, talk to your health care provider about genetic screening.  If you are at high risk for breast cancer, talk to your health care provider about having an MRI and a mammogram every year.  Breast cancer gene (BRCA) assessment is recommended for women who have family members with BRCA-related cancers. BRCA-related cancers  include: ? Breast. ? Ovarian. ? Tubal. ? Peritoneal cancers.  Results of the assessment will determine the need for genetic counseling and BRCA1 and BRCA2 testing.  Cervical Cancer Your health care provider may recommend that you be screened regularly for cancer of the pelvic organs (ovaries, uterus, and vagina). This screening involves a pelvic examination, including checking for microscopic changes to the surface of your cervix (Pap test). You may be encouraged to have this screening done every 3 years, beginning at age 65.  For women ages 44-65, health care providers may recommend pelvic exams and Pap testing every 3 years, or they may recommend the Pap and pelvic exam, combined with testing for human papilloma virus (HPV), every 5 years. Some types of HPV increase your risk of cervical cancer. Testing for HPV may also be done on women of any age with unclear Pap test results.  Other health care providers may not recommend any screening for nonpregnant women who are considered low risk for pelvic cancer and who do not have symptoms. Ask your health care provider if a screening pelvic exam is right for you.  If you have had past treatment for cervical cancer or a condition that could lead to cancer, you need Pap tests and screening for cancer for at least 20 years after your treatment. If Pap tests have been discontinued, your risk factors (such as having a new sexual partner) need to be reassessed to determine if screening should resume. Some women have medical problems that increase the chance of getting cervical cancer. In these cases, your health care provider may recommend more frequent screening and Pap tests.  Colorectal Cancer  This type of cancer can be detected and often prevented.  Routine colorectal cancer screening usually begins at 47 years of age and continues through 47 years of age.  Your health care provider may recommend screening at an earlier age if you have risk factors  for colon cancer.  Your health care provider may also recommend using home test kits to check for hidden blood in the stool.  A small camera at the end of a tube can be used to examine your colon directly (sigmoidoscopy or colonoscopy). This is done to check for the earliest forms of colorectal cancer.  Routine screening usually begins at age 19.  Direct examination of the colon should be repeated every 5-10 years through 47 years of age. However, you may need to be screened more often if early forms of precancerous polyps or small growths are found.  Skin Cancer  Check your skin from head to toe regularly.  Tell your health care provider about any new moles or changes in moles, especially if there is a change in a mole's shape or color.  Also tell your health care provider if you have a mole that is larger than the size of a pencil eraser.  Always use sunscreen. Apply sunscreen liberally and repeatedly throughout the day.  Protect yourself by wearing long sleeves, pants, a wide-brimmed hat, and sunglasses whenever you are outside.  Heart disease, diabetes, and high blood pressure  High blood pressure causes heart disease and increases the risk of stroke. High blood pressure is more likely to develop in: ? People who have blood pressure in the high end of the normal range (130-139/85-89 mm Hg). ? People who are overweight or obese. ? People who are African American.  If you are 57-39 years of age, have your blood pressure checked every 3-5 years. If you are 19 years of age or older, have your blood pressure checked every year. You should have your blood pressure measured twice-once when you are at a hospital or clinic, and once when you are not at a hospital or clinic. Record the average of the two measurements. To check your blood pressure when you are not at a hospital or clinic, you can use: ? An automated blood pressure machine at a pharmacy. ? A home blood pressure monitor.  If  you are between 19 years and 38 years old, ask your health care provider if you should take aspirin to prevent strokes.  Have regular diabetes screenings. This involves taking a blood sample to check your fasting blood sugar level. ? If you are at a normal weight and have a low risk for diabetes, have this test once every three years after 47 years of age. ? If you are overweight and have a high risk for diabetes, consider being tested at a younger age or more often. Preventing infection Hepatitis B  If you have a higher risk for hepatitis B, you should be screened for this virus. You are considered at high risk for hepatitis B if: ? You were born in a country where hepatitis B is common. Ask your health care provider which countries are considered high risk. ? Your parents were born in a high-risk country, and you have not been immunized against hepatitis B (hepatitis B vaccine). ? You have HIV or AIDS. ? You use needles to inject street drugs. ? You live with someone who has hepatitis B. ? You have had sex with someone who has hepatitis B. ? You get hemodialysis treatment. ? You take certain medicines for conditions, including cancer, organ transplantation, and autoimmune conditions.  Hepatitis C  Blood testing is recommended for: ? Everyone born from [birth year] through 1965. ? Anyone with known risk factors for hepatitis C.  Sexually transmitted infections (STIs)  You should be screened for sexually transmitted infections (STIs) including gonorrhea and chlamydia if: ? You are sexually active and are younger than 47 years of age. ? You are older than 47 years of age and your health care provider tells you that you are at risk for this type of infection. ? Your sexual activity has changed since you were last screened and you are at an increased risk for chlamydia or gonorrhea. Ask your health care provider if you are at risk.  If you do not have HIV, but are at risk, it may be recommended  that you take a prescription medicine daily to prevent HIV infection. This is called pre-exposure prophylaxis (PrEP). You are considered at risk if: ? You are sexually active and do not regularly use condoms or know the HIV status of your partner(s). ? You take drugs by injection. ? You are sexually active with a partner who has HIV.  Talk with your health care provider about whether you are at high risk of  being infected with HIV. If you choose to begin PrEP, you should first be tested for HIV. You should then be tested every 3 months for as long as you are taking PrEP. Pregnancy  If you are premenopausal and you may become pregnant, ask your health care provider about preconception counseling.  If you may become pregnant, take 400 to 800 micrograms (mcg) of folic acid every day.  If you want to prevent pregnancy, talk to your health care provider about birth control (contraception). Osteoporosis and menopause  Osteoporosis is a disease in which the bones lose minerals and strength with aging. This can result in serious bone fractures. Your risk for osteoporosis can be identified using a bone density scan.  If you are 28 years of age or older, or if you are at risk for osteoporosis and fractures, ask your health care provider if you should be screened.  Ask your health care provider whether you should take a calcium or vitamin D supplement to lower your risk for osteoporosis.  Menopause may have certain physical symptoms and risks.  Hormone replacement therapy may reduce some of these symptoms and risks. Talk to your health care provider about whether hormone replacement therapy is right for you. Follow these instructions at home:  Schedule regular health, dental, and eye exams.  Stay current with your immunizations.  Do not use any tobacco products including cigarettes, chewing tobacco, or electronic cigarettes.  If you are pregnant, do not drink alcohol.  If you are  breastfeeding, limit how much and how often you drink alcohol.  Limit alcohol intake to no more than 1 drink per day for nonpregnant women. One drink equals 12 ounces of beer, 5 ounces of wine, or 1 ounces of hard liquor.  Do not use street drugs.  Do not share needles.  Ask your health care provider for help if you need support or information about quitting drugs.  Tell your health care provider if you often feel depressed.  Tell your health care provider if you have ever been abused or do not feel safe at home. This information is not intended to replace advice given to you by your health care provider. Make sure you discuss any questions you have with your health care provider. Document Released: 06/27/2011 Document Revised: 05/19/2016 Document Reviewed: 09/15/2015 Elsevier Interactive Patient Education  [employer].    For intermittent back pain, make sure you are stretching and drinking lots of water. If you have a spasm, you can use flexeril.  Just to know, flexeril can cause side effects that may impair your thinking or reactions. Be careful if you drive or do anything that requires you to be awake and alert. void drinking alcohol, which can increase some of the side effects of Flexeril.    FLEXION RANGE OF MOTION AND STRETCHING EXERCISES: STRETCH - Flexion, Single Knee to Chest   Lie on a firm bed or floor with both legs extended in front of you.  Keeping one leg in contact with the floor, bring your opposite knee to your chest. Hold your leg in place by either grabbing behind your thigh or at your knee.  Pull until you feel a gentle stretch in your lower back.   Slowly release your grasp and repeat the exercise with the opposite side.  STRETCH - Flexion, Double Knee to Chest   Lie on a firm bed or floor with both legs extended in front of you.  Keeping one leg in contact with the floor,  bring your opposite knee to your chest.  Tense your stomach muscles to  support your back and then lift your other knee to your chest. Hold your legs in place by either grabbing behind your thighs or at your knees.  Pull both knees toward your chest until you feel a gentle stretch in your lower back.   Tense your stomach muscles and slowly return one leg at a time to the floor.  STRETCH - Low Trunk Rotation  Lie on a firm bed or floor. Keeping your legs in front of you, bend your knees so they are both pointed toward the ceiling and your feet are flat on the floor.  Extend your arms out to the side. This will stabilize your upper body by keeping your shoulders in contact with the floor.  Gently and slowly drop both knees together to one side until you feel a gentle stretch in your lower back.   Tense your stomach muscles to support your lower back as you bring your knees back to the starting position. Repeat the exercise to the other side.   EXTENSION RANGE OF MOTION AND FLEXIBILITY EXERCISES: STRETCH - Extension, Prone on Elbows   Lie on your stomach on the floor, a bed will be too soft. Place your palms about shoulder width apart and at the height of your head.  Place your elbows under your shoulders. If this is too painful, stack pillows under your chest.  Allow your body to relax so that your hips drop lower and make contact more completely with the floor.  Slowly return to lying flat on the floor.  RANGE OF MOTION - Extension, Prone Press Ups  Lie on your stomach on the floor, a bed will be too soft. Place your palms about shoulder width apart and at the height of your head.  Keeping your back as relaxed as possible, slowly straighten your elbows while keeping your hips on the floor. You may adjust the placement of your hands to maximize your comfort. As you gain motion, your hands will come more underneath your shoulders.  Slowly return to lying flat on the floor.  RANGE OF MOTION- Quadruped, Neutral Spine   Assume a hands and knees position on  a firm surface. Keep your hands under your shoulders and your knees under your hips. You may place padding under your knees for comfort.  Drop your head and point your tail bone toward the ground below you. This will round out your lower back like an angry cat.    Slowly lift your head and release your tail bone so that your back sags into a large arch, like an old horse.  Repeat this until you feel limber in your lower back.  Now, find your "sweet spot." This will be the most comfortable position somewhere between the two previous positions. This is your neutral spine. Once you have found this position, tense your stomach muscles to support your lower back.  STRENGTHENING EXERCISES - Low Back Strain These exercises may help you when beginning to rehabilitate your injury. These exercises should be done near your "sweet spot." This is the neutral, low-back arch, somewhere between fully rounded and fully arched, that is your least painful position. When performed in this safe range of motion, these exercises can be used for people who have either a flexion or extension based injury. These exercises may resolve your symptoms with or without further involvement from your physician, physical therapist or athletic trainer. While completing these exercises,  remember:   Muscles can gain both the endurance and the strength needed for everyday activities through controlled exercises.  Complete these exercises as instructed by your physician, physical therapist or athletic trainer. Increase the resistance and repetitions only as guided.  You may experience muscle soreness or fatigue, but the pain or discomfort you are trying to eliminate should never worsen during these exercises. If this pain does worsen, stop and make certain you are following the directions exactly. If the pain is still present after adjustments, discontinue the exercise until you can discuss the trouble with your  caregiver.  STRENGTHENING - Deep Abdominals, Pelvic Tilt  Lie on a firm bed or floor. Keeping your legs in front of you, bend your knees so they are both pointed toward the ceiling and your feet are flat on the floor.  Tense your lower abdominal muscles to press your lower back into the floor. This motion will rotate your pelvis so that your tail bone is scooping upwards rather than pointing at your feet or into the floor.  STRENGTHENING - Abdominals, Crunches   Lie on a firm bed or floor. Keeping your legs in front of you, bend your knees so they are both pointed toward the ceiling and your feet are flat on the floor. Cross your arms over your chest.  Slightly tip your chin down without bending your neck.  Tense your abdominals and slowly lift your trunk high enough to just clear your shoulder blades. Lifting higher can put excessive stress on the lower back and does not further strengthen your abdominal muscles.  Control your return to the starting position.  STRENGTHENING - Quadruped, Opposite UE/LE Lift   Assume a hands and knees position on a firm surface. Keep your hands under your shoulders and your knees under your hips. You may place padding under your knees for comfort.  Find your neutral spine and gently tense your abdominal muscles so that you can maintain this position. Your shoulders and hips should form a rectangle that is parallel with the floor and is not twisted.  Keeping your trunk steady, lift your right hand no higher than your shoulder and then your left leg no higher than your hip. Make sure you are not holding your breath.   Continuing to keep your abdominal muscles tense and your back steady, slowly return to your starting position. Repeat with the opposite arm and leg.  STRENGTHENING - Lower Abdominals, Double Knee Lift  Lie on a firm bed or floor. Keeping your legs in front of you, bend your knees so they are both pointed toward the ceiling and your feet are  flat on the floor.  Tense your abdominal muscles to brace your lower back and slowly lift both of your knees until they come over your hips. Be certain not to hold your breath.  POSTURE AND BODY MECHANICS CONSIDERATIONS - Low Back Strain Keeping correct posture when sitting, standing or completing your activities will reduce the stress put on different body tissues, allowing injured tissues a chance to heal and limiting painful experiences. The following are general guidelines for improved posture. Your physician or physical therapist will provide you with any instructions specific to your needs. While reading these guidelines, remember:  The exercises prescribed by your provider will help you have the flexibility and strength to maintain correct postures.  The correct posture provides the best environment for your joints to work. All of your joints have less wear and tear when properly supported by a  spine with good posture. This means you will experience a healthier, less painful body.  Correct posture must be practiced with all of your activities, especially prolonged sitting and standing. Correct posture is as important when doing repetitive low-stress activities (typing) as it is when doing a single heavy-load activity (lifting). RESTING POSITIONS Consider which positions are most painful for you when choosing a resting position. If you have pain with flexion-based activities (sitting, bending, stooping, squatting), choose a position that allows you to rest in a less flexed posture. You would want to avoid curling into a fetal position on your side. If your pain worsens with extension-based activities (prolonged standing, working overhead), avoid resting in an extended position such as sleeping on your stomach. Most people will find more comfort when they rest with their spine in a more neutral position, neither too rounded nor too arched. Lying on a non-sagging bed on your side with a pillow  between your knees, or on your back with a pillow under your knees will often provide some relief. Keep in mind, being in any one position for a prolonged period of time, no matter how correct your posture, can still lead to stiffness. PROPER SITTING POSTURE In order to minimize stress and discomfort on your spine, you must sit with correct posture. Sitting with good posture should be effortless for a healthy body. Returning to good posture is a gradual process. Many people can work toward this most comfortably by using various supports until they have the flexibility and strength to maintain this posture on their own. When sitting with proper posture, your ears will fall over your shoulders and your shoulders will fall over your hips. You should use the back of the chair to support your upper back. Your lower back will be in a neutral position, just slightly arched. You may place a small pillow or folded towel at the base of your lower back for support.  When working at a desk, create an environment that supports good, upright posture. Without extra support, muscles tire, which leads to excessive strain on joints and other tissues. Keep these recommendations in mind: CHAIR:  A chair should be able to slide under your desk when your back makes contact with the back of the chair. This allows you to work closely.  The chair's height should allow your eyes to be level with the upper part of your monitor and your hands to be slightly lower than your elbows. BODY POSITION  Your feet should make contact with the floor. If this is not possible, use a foot rest.  Keep your ears over your shoulders. This will reduce stress on your neck and lower back. INCORRECT SITTING POSTURES  If you are feeling tired and unable to assume a healthy sitting posture, do not slouch or slump. This puts excessive strain on your back tissues, causing more damage and pain. Healthier options include:  Using more support, like a  lumbar pillow.  Switching tasks to something that requires you to be upright or walking.  Talking a brief walk.  Lying down to rest in a neutral-spine position. PROLONGED STANDING WHILE SLIGHTLY LEANING FORWARD  When completing a task that requires you to lean forward while standing in one place for a long time, place either foot up on a stationary 2-4 inch high object to help maintain the best posture. When both feet are on the ground, the lower back tends to lose its slight inward curve. If this curve flattens (or becomes too  large), then the back and your other joints will experience too much stress, tire more quickly, and can cause pain. CORRECT STANDING POSTURES Proper standing posture should be assumed with all daily activities, even if they only take a few moments, like when brushing your teeth. As in sitting, your ears should fall over your shoulders and your shoulders should fall over your hips. You should keep a slight tension in your abdominal muscles to brace your spine. Your tailbone should point down to the ground, not behind your body, resulting in an over-extended swayback posture.  INCORRECT STANDING POSTURES  Common incorrect standing postures include a forward head, locked knees and/or an excessive swayback. WALKING Walk with an upright posture. Your ears, shoulders and hips should all line-up. PROLONGED ACTIVITY IN A FLEXED POSITION When completing a task that requires you to bend forward at your waist or lean over a low surface, try to find a [place] to stabilize 3 out of 4 of your limbs. You can place a hand or elbow on your thigh or rest a knee on the surface you are reaching across. This will provide you more stability so that your muscles do not fatigue as quickly. By keeping your knees relaxed, or slightly bent, you will also reduce stress across your lower back. CORRECT LIFTING TECHNIQUES DO :   Assume a wide stance. This will provide you more stability and the opportunity  to get as close as possible to the object which you are lifting.  Tense your abdominals to brace your spine. Bend at the knees and hips. Keeping your back locked in a neutral-spine position, lift using your leg muscles. Lift with your legs, keeping your back straight.  Test the weight of unknown objects before attempting to lift them.  Try to keep your elbows locked down at your sides in order get the best strength from your shoulders when carrying an object.  Always ask for help when lifting heavy or awkward objects. INCORRECT LIFTING TECHNIQUES DO NOT:   Lock your knees when lifting, even if it is a small object.  Bend and twist. Pivot at your feet or move your feet when needing to change directions.  Assume that you can safely pick up even a paper clip without proper posture.      IF you received an x-ray today, you will receive an invoice from [facility]. Please contact [facility] at [phone] with questions or concerns regarding your invoice.   IF you received labwork today, you will receive an invoice from [facility]. Please contact [facility] at [phone] with questions or concerns regarding your invoice.   Our billing staff will not be able to assist you with questions regarding bills from these companies.  You will be contacted with the lab results as soon as they are available. The fastest [facility] to get your results is to activate your My Chart account. Instructions are located on the last page of this paperwork. If you have not heard from [place] regarding the results in 2 weeks, please contact this office.

## 2018-07-16 NOTE — Progress Notes (Signed)
Becky Jensen  MRN: [id number] DOB: [birth year]  Subjective:  Pt is a 47 y.o. female who presents for annual physical exam and folllow up on anxiety/depression. Pt is fasting today.   Diet:LIkes healthy food better than unhealthy food. Only eats fast food during road trips. Gets a good range of meats, fruits, veggies, and dairy. Drinks lots of water and unsweet tea.   Exercise: Doing more, brisk 2 mile walk with dog daily. 2 other short walks throughout the day. Usually hits 10,000 steps daily.  Sleep: 8 hrs a night BM: Daily Menstrual cycles: Regular, occur monthly. Getting heavier or lighter over the past year.   Last dental exam: 2 years ago, brushes BID Last vision exam: 1 year ago, has one scheduled for today, wears Rx eyeglasses Last pap smear: 08/03/2017 Last mammogram: 07/25/2017  Vaccinations      Tetanus: >10 years ago  Anxiety and depression: Restarted on prozac '20mg'$  about one month ago. Would like to go up at this time. Feels some effect but not as much as she used to with '40mg'$ .   Patient Active Problem List   Diagnosis Date Noted  . Infertility, female 03/20/2015    No current outpatient medications on file prior to visit.   No current facility-administered medications on file prior to visit.     No Known Allergies  Social History   Socioeconomic History  . Marital status: Married    Spouse name: Not on file  . Number of children: 0  . Years of education: Not on file  . Highest education level: Not on file  Occupational History  . 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 Financial resource strain: Not on file  . Food insecurity:    Worry: Not on file    Inability: Not on file  . Transportation needs:    Medical: Not on file    Non-medical: Not on file  Tobacco Use  . Smoking status: Never Smoker  . Smokeless tobacco: Never Used  Substance and Sexual Activity  . Alcohol use: Yes    Comment: 10 drinks per week  . Drug use: No    . Sexual activity: Yes    Partners: Male    Birth control/protection: None  Lifestyle  . Physical activity:    Days per week: 5 days    Minutes per session: 30 min  . Stress: Not on file  Relationships  . Social connections:    Talks on phone: More than three times a week    Gets together: Once a week    Attends religious service: Never    Active member of club or organization: No    Attends meetings of clubs or organizations: Never    Relationship status: Married  Other Topics Concern  . Not on file  Social History Narrative   Pt is from [place]. Moved to [place] in 2004. Lives at home with husband.     Past Surgical History:  Procedure Laterality Date  . TONSILLECTOMY      Family History  Problem Relation Age of Onset  . Hyperlipidemia Mother   . Hypertension Father   . Cancer Maternal Grandmother   . Heart disease Maternal Grandfather   . Cancer Paternal Grandmother   . Hypertension Paternal Grandfather   . Stroke Paternal Grandfather   . Breast cancer Maternal Aunt   . Breast cancer Cousin     Review of Systems  Constitutional: Negative for activity change,  appetite change, chills, diaphoresis, fatigue, fever and unexpected weight change.  HENT: Negative for congestion, dental problem, drooling, ear discharge, ear pain, facial swelling, hearing loss, mouth sores, nosebleeds, postnasal drip, rhinorrhea, sinus pressure, sinus pain, sneezing, sore throat, tinnitus, trouble swallowing and voice change.   Eyes: Negative for photophobia, pain, discharge, redness, itching and visual disturbance.  Respiratory: Negative for apnea, cough, choking, chest tightness, shortness of breath, wheezing and stridor.   Cardiovascular: Negative for chest pain, palpitations and leg swelling.  Gastrointestinal: Negative for abdominal distention, abdominal pain, anal bleeding, blood in stool, constipation, diarrhea, nausea, rectal pain and vomiting.  Endocrine: Negative for cold  intolerance, heat intolerance, polydipsia, polyphagia and polyuria.  Genitourinary: Negative for decreased urine volume, difficulty urinating, dyspareunia, dysuria, enuresis, flank pain, frequency, genital sores, hematuria, menstrual problem, pelvic pain, urgency, vaginal bleeding, vaginal discharge and vaginal pain.  Musculoskeletal: Positive for back pain (intermittent back pain, feels like spasms, associated with weight gain, improved with stretching and heating pad, not present today). Negative for arthralgias, gait problem, joint swelling, myalgias, neck pain and neck stiffness.  Skin: Negative for color change, pallor, rash and wound.  Allergic/Immunologic: Negative for environmental allergies, food allergies and immunocompromised state.  Neurological: Negative for dizziness, tremors, seizures, syncope, facial asymmetry, speech difficulty, weakness, light-headedness, numbness and headaches.  Hematological: Negative for adenopathy. Does not bruise/bleed easily.  Psychiatric/Behavioral: Negative for agitation, behavioral problems, confusion, decreased concentration, dysphoric mood, hallucinations, self-injury, sleep disturbance and suicidal ideas. The patient is not nervous/anxious and is not hyperactive.     Objective:  BP 132/82 (BP Location: Right Arm, Patient Position: Sitting, Cuff Size: Normal)   Pulse 96   Temp 98.7 F (37.1 C) (Oral)   Resp 20   Ht 5' 2.28" (1.582 m)   Wt 187 lb (84.8 kg)   LMP 06/29/2018 (Approximate)   SpO2 95%   BMI 33.89 kg/m   Physical Exam  Constitutional: She is oriented to person, place, and time. She appears well-developed and well-nourished. No distress.  HENT:  Head: Normocephalic and atraumatic.  Right Ear: Hearing, tympanic membrane, external ear and ear canal normal.  Left Ear: Hearing, tympanic membrane, external ear and ear canal normal.  Nose: Nose normal.  Mouth/Throat: Uvula is midline, oropharynx is clear and moist and mucous membranes are  normal. No oropharyngeal exudate.  Eyes: Pupils are equal, round, and reactive to light. Conjunctivae, EOM and lids are normal. No scleral icterus.  Neck: Trachea normal and normal range of motion. No thyroid mass and no thyromegaly present.  Cardiovascular: Normal rate, regular rhythm, normal heart sounds and intact distal pulses.  Pulmonary/Chest: Effort normal and breath sounds normal.  Abdominal: Soft. Normal appearance and bowel sounds are normal. There is no tenderness.  Lymphadenopathy:       Head (right side): No tonsillar, no preauricular, no posterior auricular and no occipital adenopathy present.       Head (left side): No tonsillar, no preauricular, no posterior auricular and no occipital adenopathy present.    She has no cervical adenopathy.       Right: No supraclavicular adenopathy present.       Left: No supraclavicular adenopathy present.  Neurological: She is alert and oriented to person, place, and time. She has normal strength and normal reflexes.  Skin: Skin is warm and dry.    Visual Acuity Screening   Right eye Left eye Both eyes  Without correction:     With correction: '20/20 20/20 20/20 '$    BP Readings from Last  3 Encounters:  07/16/18 132/82  06/12/18 118/72  08/03/17 130/86   Wt Readings from Last 3 Encounters:  07/16/18 187 lb (84.8 kg)  06/12/18 190 lb (86.2 kg)  08/03/17 183 lb (83 kg)     Assessment and Plan :  Discussed healthy lifestyle, diet, exercise, preventative care, vaccinations, and addressed patient's concerns. Plan for follow up in 3-6 months.  Otherwise, plan for specific conditions below.  1. Annual physical exam Await lab results.   2. Screening for deficiency anemia - CBC with Differential/Platelet  3. Screening cholesterol level Has had HLD in the past, but has never been on medication. Diet controlled.  - Lipid panel  4. Screening for metabolic disorder - [id number]  5. Screening for hematuria or proteinuria -  Urinalysis, dipstick only  6. Mild sun exposure, initial encounter - Ambulatory referral to Dermatology  7. Anxiety and depression Increase to '40mg'$ . Return if no improvement in 4 weeks. Otherwise, f/u in 3-6 months.  - FLUoxetine (PROZAC) 40 MG capsule; Take 1 capsule (40 mg total) by mouth daily.  Dispense: 90 capsule; Refill: 1  8. Muscle spasm of back Not present today. Encouraged stretching, heating pad, and hydration when it occurs. Rx for flexeril as needed.  - cyclobenzaprine (FLEXERIL) 5 MG tablet; Take 1 tablet (5 mg total) by mouth 3 (three) times daily as needed for muscle spasms.  Dispense: 60 tablet; Refill: 0   Tenna Delaine, PA-C  [facility] 07/16/2018 10:06 AM

## 2018-07-17 LAB — CMP14+EGFR
A/G RATIO: 1.6 (ref 1.2–2.2)
ALK PHOS: 74 IU/L (ref 39–117)
ALT: 11 IU/L (ref 0–32)
AST: 14 IU/L (ref 0–40)
Albumin: 4.2 g/dL (ref 3.5–5.5)
BILIRUBIN TOTAL: 0.3 mg/dL (ref 0.0–1.2)
BUN/Creatinine Ratio: 19 (ref 9–23)
BUN: 12 mg/dL (ref 6–24)
CO2: 21 mmol/L (ref 20–29)
Calcium: 9.6 mg/dL (ref 8.7–10.2)
Chloride: 103 mmol/L (ref 96–106)
Creatinine, Ser: 0.64 mg/dL (ref 0.57–1.00)
GFR calc Af Amer: 124 mL/min/{1.73_m2} (ref >59)
GFR calc non Af Amer: 107 mL/min/{1.73_m2} (ref >59)
GLOBULIN, TOTAL: 2.7 g/dL (ref 1.5–4.5)
Glucose: 107 mg/dL — ABNORMAL HIGH (ref 65–99)
POTASSIUM: 4.9 mmol/L (ref 3.5–5.2)
SODIUM: 142 mmol/L (ref 134–144)
Total Protein: 6.9 g/dL (ref 6.0–8.5)

## 2018-07-17 LAB — URINALYSIS, DIPSTICK ONLY
BILIRUBIN UA: NEGATIVE
GLUCOSE, UA: NEGATIVE
Ketones, UA: NEGATIVE
Leukocytes, UA: NEGATIVE
Nitrite, UA: NEGATIVE
PROTEIN UA: NEGATIVE
RBC, UA: NEGATIVE
SPEC GRAV UA: 1.018 (ref 1.005–1.030)
Urobilinogen, Ur: 0.2 mg/dL (ref 0.2–1.0)
pH, UA: 5.5 (ref 5.0–7.5)

## 2018-07-17 LAB — CBC WITH DIFFERENTIAL/PLATELET
BASOS ABS: 0 10*3/uL (ref 0.0–0.2)
Basos: 1 %
EOS (ABSOLUTE): 0.1 10*3/uL (ref 0.0–0.4)
Eos: 2 %
Hematocrit: 43 % (ref 34.0–46.6)
Hemoglobin: 14.7 g/dL (ref 11.1–15.9)
Immature Grans (Abs): 0 10*3/uL (ref 0.0–0.1)
Immature Granulocytes: 0 %
Lymphocytes Absolute: 2.5 10*3/uL (ref 0.7–3.1)
Lymphs: 29 %
MCH: 32.4 pg (ref 26.6–33.0)
MCHC: 34.2 g/dL (ref 31.5–35.7)
MCV: 95 fL (ref 79–97)
MONOS ABS: 0.8 10*3/uL (ref 0.1–0.9)
Monocytes: 9 %
NEUTROS ABS: 5.1 10*3/uL (ref 1.4–7.0)
Neutrophils: 59 %
Platelets: 371 10*3/uL (ref 150–450)
RBC: 4.54 x10E6/uL (ref 3.77–5.28)
RDW: 13.3 % (ref 12.3–15.4)
WBC: 8.5 10*3/uL (ref 3.4–10.8)

## 2018-07-17 LAB — LIPID PANEL
CHOL/HDL RATIO: 3.4 ratio (ref 0.0–4.4)
Cholesterol, Total: 262 mg/dL — ABNORMAL HIGH (ref 100–199)
HDL: 77 mg/dL (ref >39)
LDL CALC: 161 mg/dL — AB (ref 0–99)
TRIGLYCERIDES: 120 mg/dL (ref 0–149)
VLDL CHOLESTEROL CAL: 24 mg/dL (ref 5–40)

## 2018-07-18 NOTE — Addendum Note (Signed)
Addended by: Benson Setting L on: 07/18/2018 08:19 AM   Modules accepted: Orders

## 2018-07-19 LAB — HGB A1C W/O EAG: Hgb A1c MFr Bld: 5.5 % (ref 4.8–5.6)

## 2018-07-19 LAB — SPECIMEN STATUS REPORT

## 2018-08-02 ENCOUNTER — Ambulatory Visit
Admission: RE | Admit: 2018-08-02 | Discharge: 2018-08-02 | Disposition: A | Discharge status: Home / Self Care | Payer: Commercial Managed Care - PPO | Source: Ambulatory Visit | Attending: Physician Assistant | Admitting: Physician Assistant

## 2018-08-02 DIAGNOSIS — Z1231 Encounter for screening mammogram for malignant neoplasm of breast: Secondary | ICD-10-CM

## 2018-08-11 IMAGING — MG DIGITAL SCREENING BILATERAL MAMMOGRAM WITH TOMO AND CAD
8 series · 8 of 24 positions shown · non-contrast
Comparison: Previous exam(s).

CLINICAL DATA: Screening.

EXAM:
DIGITAL SCREENING BILATERAL MAMMOGRAM WITH TOMO AND CAD

[L CC synth-2D]
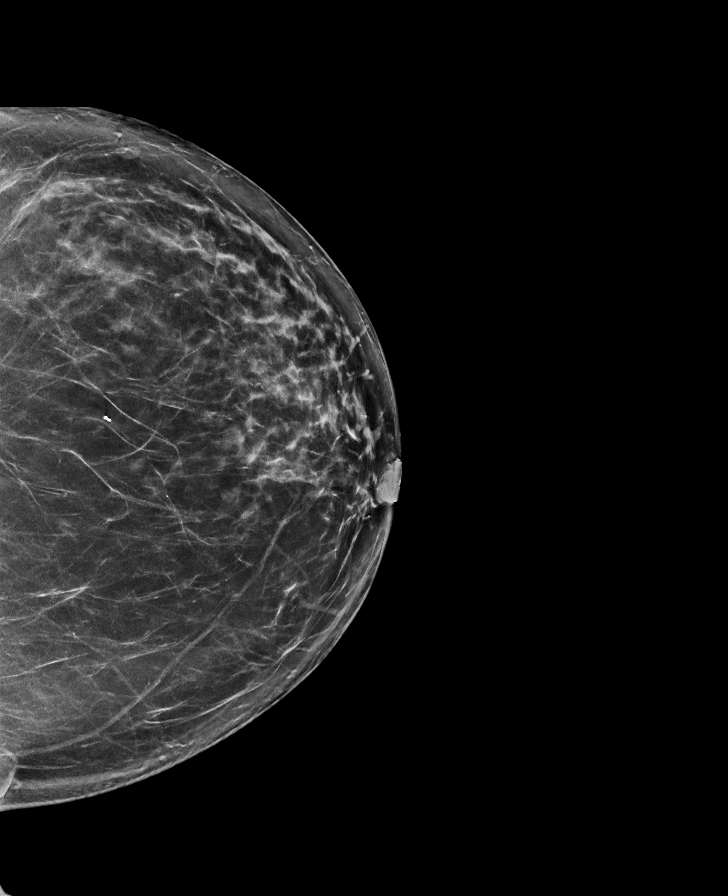

[R CC synth-2D]
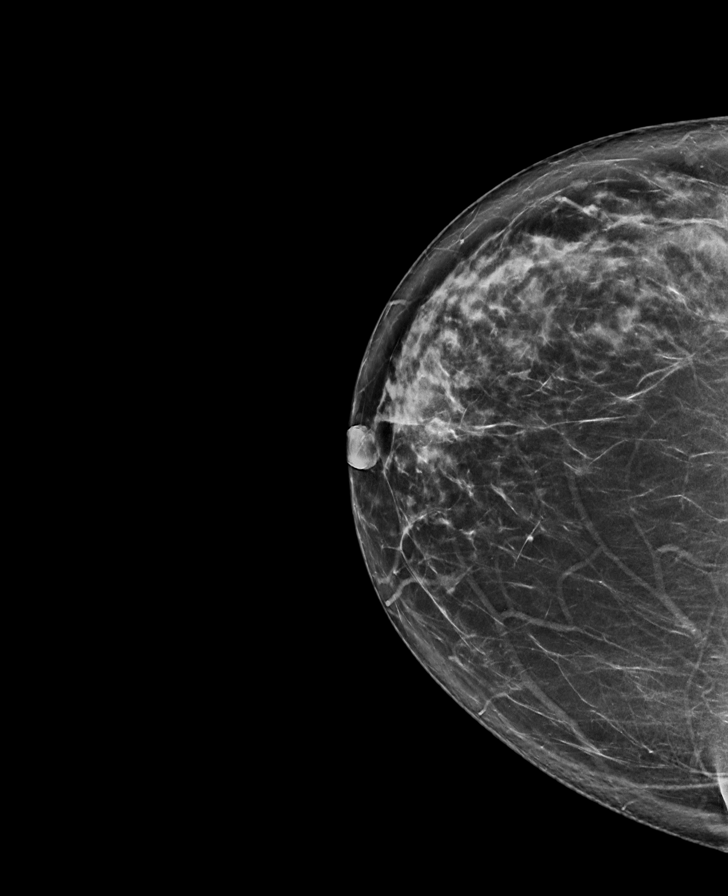

[L MLO synth-2D]
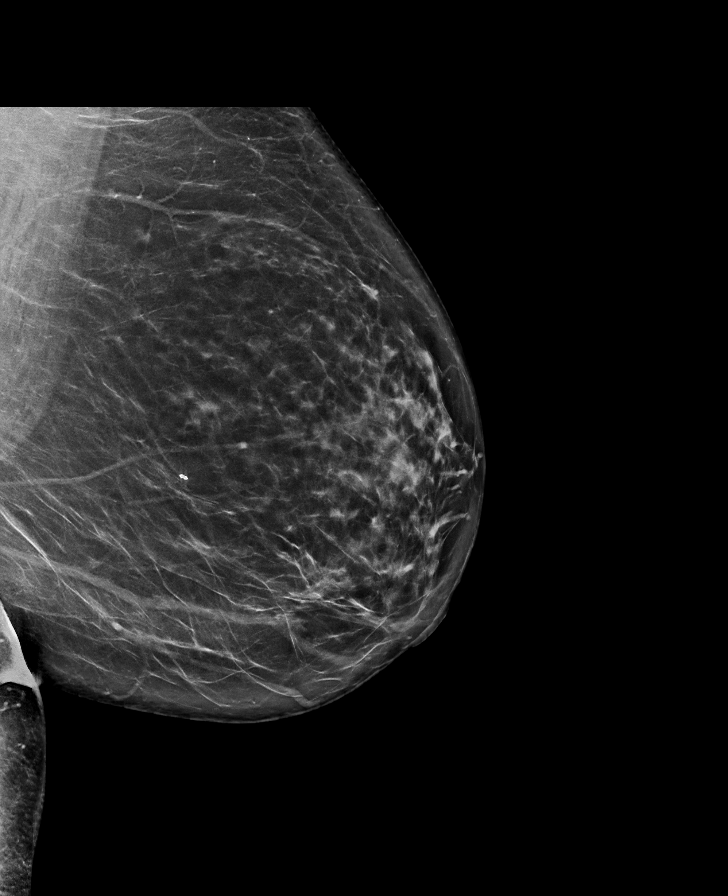

[R MLO synth-2D]
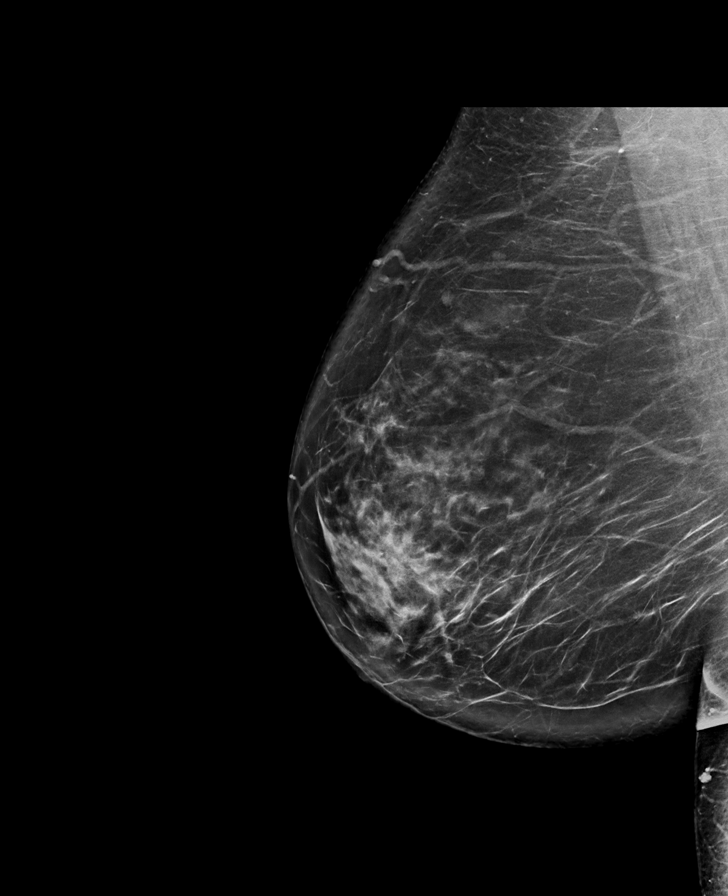

[R CC tomo · tomo slice 40/79.0]
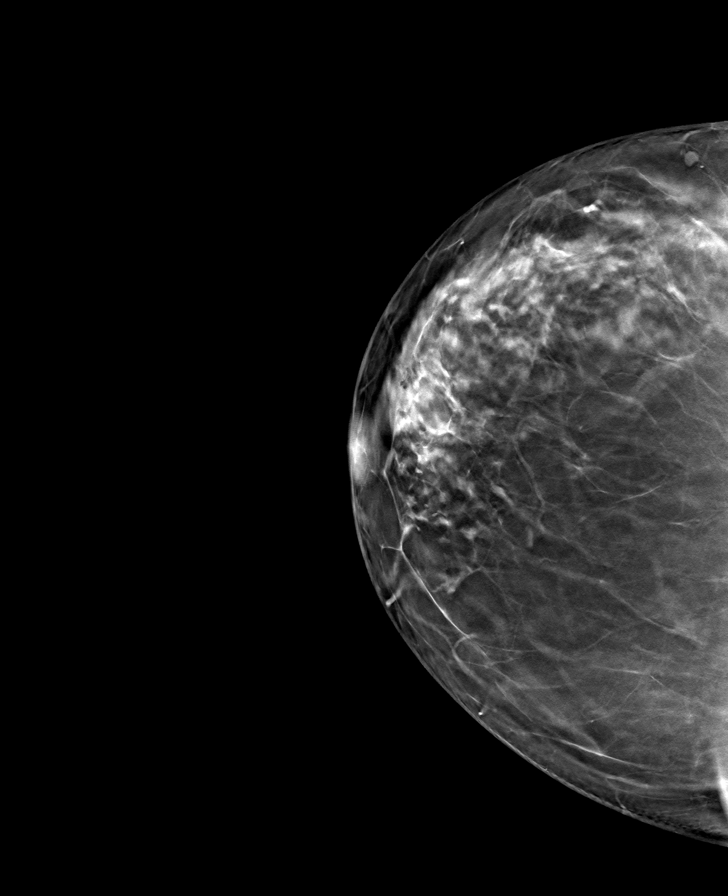

[R MLO tomo · tomo slice 48/95.0]
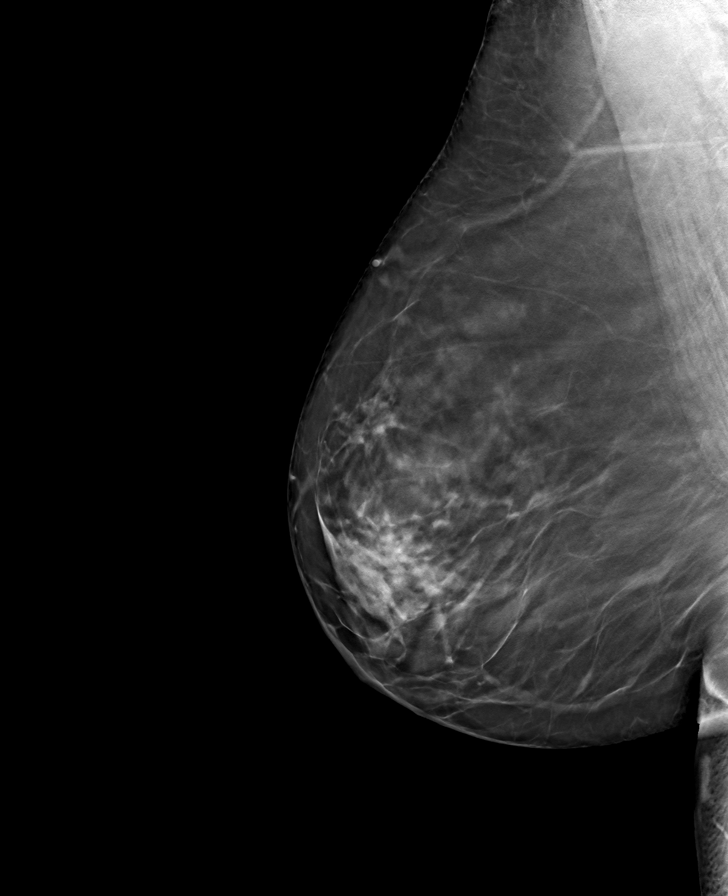

[L CC tomo · tomo slice 42/83.0]
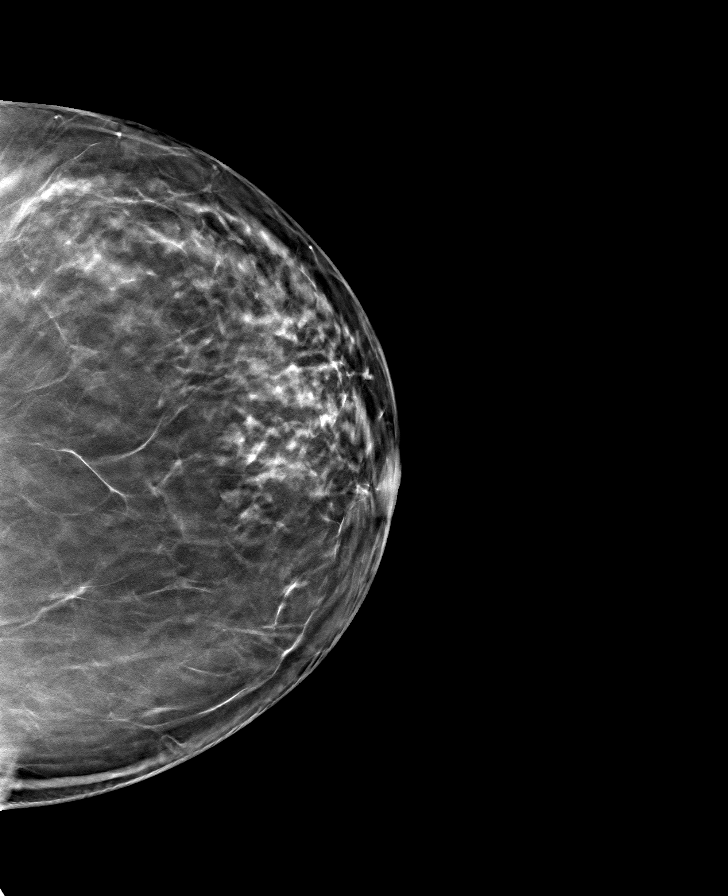

[L MLO tomo · tomo slice 47/92.0]
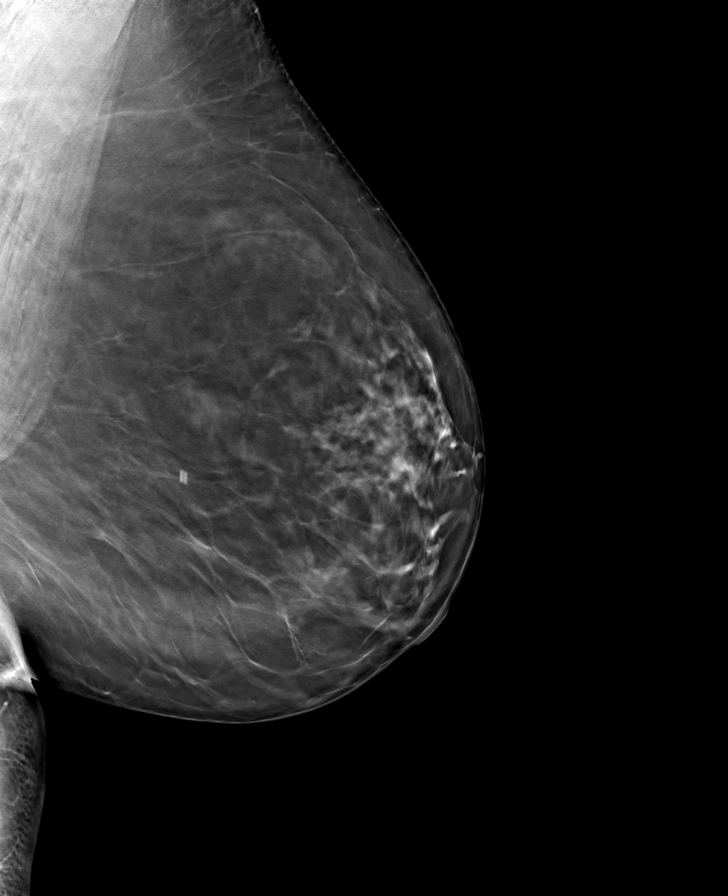

[8 of 24 positions shown; findings below may reference images not displayed]

ACR Breast Density Category c: The breast tissue is heterogeneously
dense, which may obscure small masses.
FINDINGS: There are no findings suspicious for malignancy. Images were
processed with CAD.
IMPRESSION: No mammographic evidence of malignancy. A result letter of this
screening mammogram will be mailed directly to the patient.

RECOMMENDATION:
Screening mammogram in one year. (Code:[id number]-U-LHB)

BI-RADS CATEGORY  1: Negative.

## 2018-08-26 HISTORY — PX: ANKLE FRACTURE SURGERY: SHX122

## 2018-08-31 ENCOUNTER — Encounter (HOSPITAL_COMMUNITY): Payer: Self-pay | Admitting: Emergency Medicine

## 2018-08-31 ENCOUNTER — Emergency Department (HOSPITAL_COMMUNITY): Payer: Commercial Managed Care - PPO

## 2018-08-31 ENCOUNTER — Emergency Department (HOSPITAL_COMMUNITY)
Admission: EM | Admit: 2018-08-31 | Discharge: 2018-09-01 | Disposition: A | Discharge status: Home / Self Care | Payer: Commercial Managed Care - PPO | Attending: Emergency Medicine | Admitting: Emergency Medicine

## 2018-08-31 DIAGNOSIS — S8261XA Displaced fracture of lateral malleolus of right fibula, initial encounter for closed fracture: Secondary | ICD-10-CM | POA: Diagnosis not present

## 2018-08-31 DIAGNOSIS — Z79899 Other long term (current) drug therapy: Secondary | ICD-10-CM | POA: Insufficient documentation

## 2018-08-31 DIAGNOSIS — S8251XA Displaced fracture of medial malleolus of right tibia, initial encounter for closed fracture: Secondary | ICD-10-CM | POA: Insufficient documentation

## 2018-08-31 DIAGNOSIS — S82841A Displaced bimalleolar fracture of right lower leg, initial encounter for closed fracture: Secondary | ICD-10-CM | POA: Insufficient documentation

## 2018-08-31 DIAGNOSIS — Y939 Activity, unspecified: Secondary | ICD-10-CM | POA: Insufficient documentation

## 2018-08-31 DIAGNOSIS — W19XXXA Unspecified fall, initial encounter: Secondary | ICD-10-CM | POA: Diagnosis not present

## 2018-08-31 DIAGNOSIS — Y929 Unspecified place or not applicable: Secondary | ICD-10-CM | POA: Diagnosis not present

## 2018-08-31 DIAGNOSIS — W1830XA Fall on same level, unspecified, initial encounter: Secondary | ICD-10-CM | POA: Diagnosis not present

## 2018-08-31 DIAGNOSIS — Y999 Unspecified external cause status: Secondary | ICD-10-CM | POA: Insufficient documentation

## 2018-08-31 DIAGNOSIS — R0902 Hypoxemia: Secondary | ICD-10-CM | POA: Diagnosis not present

## 2018-08-31 DIAGNOSIS — S93402A Sprain of unspecified ligament of left ankle, initial encounter: Secondary | ICD-10-CM | POA: Insufficient documentation

## 2018-08-31 DIAGNOSIS — S99911A Unspecified injury of right ankle, initial encounter: Secondary | ICD-10-CM | POA: Diagnosis present

## 2018-08-31 DIAGNOSIS — M25571 Pain in right ankle and joints of right foot: Secondary | ICD-10-CM | POA: Diagnosis not present

## 2018-08-31 DIAGNOSIS — I959 Hypotension, unspecified: Secondary | ICD-10-CM | POA: Diagnosis not present

## 2018-08-31 MED ORDER — FENTANYL CITRATE (PF) 100 MCG/2ML IJ SOLN: 75.0000 ug | Freq: Once | INTRAMUSCULAR | Status: DC

## 2018-08-31 MED ORDER — HYDROCODONE-ACETAMINOPHEN 5-325 MG PO TABS
1.0000 | ORAL_TABLET | Freq: Once | ORAL | Status: AC
  Administered 2018-08-31: 1 via ORAL
  Filled 2018-08-31: qty 1

## 2018-08-31 MED ORDER — SODIUM CHLORIDE 0.9 % IV BOLUS
500.0000 mL | Freq: Once | INTRAVENOUS | Status: AC
  Administered 2018-08-31: 500 mL via INTRAVENOUS

## 2018-08-31 NOTE — ED Notes (Signed)
Bed: WA21 Expected date:  Expected time:  Means of arrival:  Comments: Fall, ankle injury

## 2018-08-31 NOTE — ED Triage Notes (Signed)
Patient stepped down twisted her right ankle and fell backward. She didn't suffer a blow to the head. Reportedly she can move her foot however there was noted deformity. Her pain level in the right ankle is now a 5/10 after being splinted, applying ice and being given fentanyl. She was also given 4 mg of zofran.

## 2018-08-31 NOTE — ED Provider Notes (Signed)
[facility] DEPT Provider Note  CSN: [id number] Arrival date & time: 08/31/18 2214  Chief Complaint(s) Ankle Pain and Nausea  HPI Becky Jensen is a 47 y.o. female who had a mechanical fall after rolling her right ankle while stepping down.  This caused severe right ankle pain and deformity.  Patient also endorsed left ankle pain and swelling.  Right ankle pain is moderate aching/throbbing exacerbated with palpation and range of motion.  Alleviated by immobility.  Patient reports that the swelling of the left ankle has improved.  Pain is exacerbated with palpation of the lateral malleolus and surrounding tissue.  Alleviated by immobility.  She denied any head trauma or loss of consciousness.  Denies any headache, neck pain, back pain, upper extremity pain, chest pain, abdominal pain, hip pain.  HPI  Past Medical History Past Medical History:  Diagnosis Date  . Depression   . Hyperlipidemia    Patient Active Problem List   Diagnosis Date Noted  . Infertility, female 03/20/2015   Home Medication(s) Prior to Admission medications   Medication Sig Start Date End Date Taking? Authorizing Provider  FLUoxetine (PROZAC) 40 MG capsule Take 1 capsule (40 mg total) by mouth daily. 07/16/18  Yes Timmothy Euler, [place] D, PA-C  ibuprofen (ADVIL,MOTRIN) 200 MG tablet Take 400 mg by mouth every 6 (six) hours as needed for moderate pain.   Yes [provider]  Multiple Vitamin (MULTIVITAMIN WITH MINERALS) TABS tablet Take 1 tablet by mouth daily.   Yes [provider]  acetaminophen (TYLENOL) 500 MG tablet Take 2 tablets (1,000 mg total) by mouth every 8 (eight) hours for 5 days. Do not take more than 4000 mg of acetaminophen (Tylenol) in a 24-hour period. Please note that other medicines that you may be prescribed may have Tylenol as well. 09/01/18 09/06/18  Fatima Blank, MD  cyclobenzaprine (FLEXERIL) 5 MG tablet Take 1 tablet (5 mg total) by mouth 3  (three) times daily as needed for muscle spasms. Patient not taking: Reported on 08/31/2018 07/16/18   Leonie Douglas, PA-C  HYDROcodone-acetaminophen (NORCO/VICODIN) 5-325 MG tablet Take 1 tablet by mouth every 8 (eight) hours as needed for up to 5 days for severe pain (That is not improved by your scheduled acetaminophen regimen). Please do not exceed 4000 mg of acetaminophen (Tylenol) a 24-hour period. Please note that he may be prescribed additional medicine that contains acetaminophen. 09/01/18 09/06/18  Fatima Blank, MD                                                                                                                                    Past Surgical History Past Surgical History:  Procedure Laterality Date  . TONSILLECTOMY     Family History Family History  Problem Relation Age of Onset  . Hyperlipidemia Mother   . Hypertension Father   . Cancer Maternal Grandmother   . Heart disease Maternal Grandfather   .  Cancer Paternal Grandmother   . Hypertension Paternal Grandfather   . Stroke Paternal Grandfather   . Breast cancer Maternal Aunt   . Breast cancer Cousin     Social History Social History   Tobacco Use  . Smoking status: Never Smoker  . Smokeless tobacco: Never Used  Substance Use Topics  . Alcohol use: Yes    Comment: 10 drinks per week  . Drug use: No   Allergies Patient has no known allergies.  Review of Systems Review of Systems All other systems are reviewed and are negative for acute change except as noted in the HPI  Physical Exam Vital Signs  I have reviewed the triage vital signs BP (!) 103/58 (BP Location: Right Arm)   Pulse 71   Resp 17   Ht 5\' 2"  (1.575 m)   Wt 83.9 kg   SpO2 99%   BMI 33.84 kg/m   Physical Exam  Constitutional: She is oriented to person, place, and time. She appears well-developed and well-nourished. No distress.  HENT:  Head: Normocephalic and atraumatic.  Right Ear: External ear normal.  Left  Ear: External ear normal.  Nose: Nose normal.  Eyes: Pupils are equal, round, and reactive to light. Conjunctivae and EOM are normal. Right eye exhibits no discharge. Left eye exhibits no discharge. No scleral icterus.  Neck: Normal range of motion. Neck supple.  Cardiovascular: Normal rate, regular rhythm and normal heart sounds. Exam reveals no gallop and no friction rub.  No murmur heard. Pulses:      Radial pulses are 2+ on the right side, and 2+ on the left side.       Dorsalis pedis pulses are 2+ on the right side, and 2+ on the left side.  Pulmonary/Chest: Effort normal and breath sounds normal. No stridor. No respiratory distress. She has no wheezes.  Abdominal: Soft. She exhibits no distension. There is no tenderness.  Musculoskeletal: She exhibits no edema.       Right ankle: She exhibits decreased range of motion, swelling and deformity. She exhibits normal pulse. Tenderness. Lateral malleolus and medial malleolus tenderness found.       Left ankle: She exhibits normal range of motion, no swelling, no deformity and normal pulse. Tenderness. Lateral malleolus tenderness found.       Cervical back: She exhibits no bony tenderness.       Thoracic back: She exhibits no bony tenderness.       Lumbar back: She exhibits no bony tenderness.  Clavicles stable. Chest stable to AP/Lat compression. Pelvis stable to Lat compression. No obvious extremity deformity. No chest or abdominal wall contusion.  Neurological: She is alert and oriented to person, place, and time.  Moving all extremities  Skin: Skin is warm and dry. No rash noted. She is not diaphoretic. No erythema.  Psychiatric: She has a normal mood and affect.    ED Results and Treatments Labs (all labs ordered are listed, but only abnormal results are displayed) Labs Reviewed - No data to display  EKG  EKG  Interpretation  Date/Time:    Ventricular Rate:    PR Interval:    QRS Duration:   QT Interval:    QTC Calculation:   R Axis:     Text Interpretation:        Radiology Dg Ankle 2 Views Right  Result Date: 09/01/2018 CLINICAL DATA:  Reduction of ankle fracture EXAM: RIGHT ANKLE - 2 VIEW COMPARISON:  None. FINDINGS: There is near anatomic alignment of the bimalleolar right ankle fracture following reduction and splinting. IMPRESSION: Near anatomic alignment bimalleolar right ankle fracture. Electronically Signed   By: Ulyses Jarred M.D.   On: 09/01/2018 00:15   Dg Ankle Complete Left  Result Date: 09/01/2018 CLINICAL DATA:  Left ankle pain after twisting injury and fall. EXAM: LEFT ANKLE COMPLETE - 3+ VIEW COMPARISON:  01/28/2006 FINDINGS: Lateral soft tissue swelling over the left ankle. No evidence of acute fracture or dislocation. No focal bone lesion or bone destruction. Bone cortex appears intact. IMPRESSION: Lateral soft tissue swelling. No acute bony abnormalities. Electronically Signed   By: Lucienne Capers M.D.   On: 09/01/2018 00:20   Dg Ankle Complete Right  Result Date: 08/31/2018 CLINICAL DATA:  Right ankle pain following a twisting injury. EXAM: RIGHT ANKLE - COMPLETE 3+ VIEW COMPARISON:  None. FINDINGS: Fracture of the medial malleolus with significant lateral displacement of the distal fragment as well as lateral subluxation of the talus relative to the tibia. There is also a lateral malleolus fracture with 1/2 shaft width of lateral displacement of the distal fragment. There is also lateral angulation of the distal fragments. The posterior malleolus appears intact except for the possibility of a small curvilinear avulsion fracture. No effusion is seen. Diffuse soft tissue swelling. IMPRESSION: 1. Bimalleolar fracture/subluxation, as described above. 2. Possible small curvilinear avulsion fracture off the posterior aspect of the posterior malleolus. Electronically Signed   By:  Claudie Revering M.D.   On: 08/31/2018 23:01   Dg Foot Complete Right  Result Date: 08/31/2018 CLINICAL DATA:  Right foot pain following a twisting injury. EXAM: RIGHT FOOT COMPLETE - 3+ VIEW COMPARISON:  Right ankle obtained at the same time. FINDINGS: Previously described bimalleolar fracture. A small avulsion fracture off the posterior aspect of the posterior malleolus is confirmed on these images. No foot fracture or dislocation seen IMPRESSION: 1. Previously demonstrated bimalleolar fracture/subluxation. 2. Small linear avulsion fracture off the posterior aspect of the posterior malleolus. 3. No foot fracture or dislocation. Electronically Signed   By: Claudie Revering M.D.   On: 08/31/2018 23:04   Pertinent labs & imaging results that were available during my care of the patient were reviewed by me and considered in my medical decision making (see chart for details).  Medications Ordered in ED Medications  sodium chloride 0.9 % bolus 500 mL (500 mLs Intravenous New Bag/Given 08/31/18 2354)  HYDROcodone-acetaminophen (NORCO/VICODIN) 5-325 MG per tablet 1 tablet (1 tablet Oral Given 08/31/18 2353)  Procedures .Ortho Injury Treatment Date/Time: 09/01/2018 12:50 AM Performed by: Fatima Blank, MD Authorized by: Fatima Blank, MD   Consent:    Consent obtained:  Verbal   Consent given by:  Patient   Risks discussed:  Nerve damage, recurrent dislocation, vascular damage and irreducible dislocation   Alternatives discussed:  ImmobilizationInjury location: ankle Location details: right ankle Injury type: fracture-dislocation Fracture type: bimalleolar Pre-procedure neurovascular assessment: neurovascularly intact  Anesthesia: Local anesthesia used: no  Patient sedated: NoManipulation performed: yes Skeletal traction used: yes Reduction successful:  yes Immobilization: splint Splint type: [place] (stirrups and posterior) Supplies used: Ortho-Glass Post-procedure neurovascular assessment: post-procedure neurovascularly intact Patient tolerance: Patient tolerated the procedure well with no immediate complications     (including critical care time)  Medical Decision Making / ED Course I have reviewed the nursing notes for this encounter and the patient's prior records (if available in EHR or on provided paperwork).    Mechanical fall resulting in right ankle bimalleolar fracture with a avulsion fracture of the right posterior medial malleolus confirmed by plain film.  Neurovascularly intact distally.  Reduction and splint as above.  Patient was provided with oral pain medicine.  Plain film of the left ankle without fracture dislocation.  Neurovascular intact distally.  Likely sprain.  ASO brace applied.  Follow-up with orthopedic surgery next week.   No other injuries noted on exam requiring further evaluation.  The patient appears reasonably screened and/or stabilized for discharge and I doubt any other medical condition or other [place] requiring further screening, evaluation, or treatment in the ED at this time prior to discharge.  The patient is safe for discharge with strict return precautions.   Final Clinical Impression(s) / ED Diagnoses Final diagnoses:  Fall  Closed bimalleolar fracture of right ankle, initial encounter  Closed avulsion fracture of medial malleolus of right tibia, initial encounter  Sprain of left ankle, unspecified ligament, initial encounter     Disposition: Discharge  Condition: Good  I have discussed the results, Dx and Tx plan with the patient who expressed understanding and agree(s) with the plan. Discharge instructions discussed at great length. The patient was given strict return precautions who verbalized understanding of the instructions. No further questions at time of discharge.    ED  Discharge Orders         Ordered    acetaminophen (TYLENOL) 500 MG tablet  Every 8 hours     09/01/18 0043    HYDROcodone-acetaminophen (NORCO/VICODIN) 5-325 MG tablet  Every 8 hours PRN     09/01/18 0043           Follow Up: Leonie Douglas, PA-C [place] [address] [phone]  Schedule an appointment as soon as possible for a visit  As needed for continued pain control  Marybelle Killings, MD [place] [address] [phone]  Schedule an appointment as soon as possible for a visit  in 5-7 days, For close follow up to assess for right ankle fracture    This chart was dictated using voice recognition software.  Despite best efforts to proofread,  errors can occur which can change the documentation meaning.   Fatima Blank, MD 09/01/18 [phone]

## 2018-09-01 DIAGNOSIS — S82841A Displaced bimalleolar fracture of right lower leg, initial encounter for closed fracture: Secondary | ICD-10-CM | POA: Diagnosis not present

## 2018-09-01 DIAGNOSIS — M25572 Pain in left ankle and joints of left foot: Secondary | ICD-10-CM | POA: Diagnosis not present

## 2018-09-01 DIAGNOSIS — M7989 Other specified soft tissue disorders: Secondary | ICD-10-CM | POA: Diagnosis not present

## 2018-09-01 DIAGNOSIS — S99912A Unspecified injury of left ankle, initial encounter: Secondary | ICD-10-CM | POA: Diagnosis not present

## 2018-09-01 MED ORDER — HYDROCODONE-ACETAMINOPHEN 5-325 MG PO TABS: 1.0000 | ORAL_TABLET | Freq: Three times a day (TID) | ORAL | 0 refills | Status: DC | PRN

## 2018-09-01 MED ORDER — ACETAMINOPHEN 500 MG PO TABS: 1000.0000 mg | ORAL_TABLET | Freq: Three times a day (TID) | ORAL | 0 refills | Status: AC

## 2018-09-03 ENCOUNTER — Other Ambulatory Visit: Payer: Self-pay

## 2018-09-03 ENCOUNTER — Encounter: Payer: Self-pay | Admitting: Physician Assistant

## 2018-09-03 ENCOUNTER — Ambulatory Visit: Payer: Commercial Managed Care - PPO | Admitting: Physician Assistant

## 2018-09-03 VITALS — BP 151/90 | HR 112 | Temp 99.0°F | Resp 18

## 2018-09-03 DIAGNOSIS — S82841A Displaced bimalleolar fracture of right lower leg, initial encounter for closed fracture: Secondary | ICD-10-CM | POA: Diagnosis not present

## 2018-09-03 DIAGNOSIS — S8251XA Displaced fracture of medial malleolus of right tibia, initial encounter for closed fracture: Secondary | ICD-10-CM | POA: Diagnosis not present

## 2018-09-03 MED ORDER — HYDROCODONE-ACETAMINOPHEN 5-325 MG PO TABS: 1.0000 | ORAL_TABLET | ORAL | 0 refills | Status: AC | PRN

## 2018-09-03 NOTE — Progress Notes (Signed)
Becky Jensen  MRN: [id number] DOB: December 12, 1971  Subjective:  Becky Jensen is a 47 y.o. female seen in office today for a chief complaint of follow-up on closed bimalleolar fracture of right ankle and closed avulsion fracture medial malleolus of right tibia.  Patient initially seen on 08/31/2018.  Was placed in splint and given Rx for Norco and Tylenol.  Today, notes that the instructions on Norco said to take every 8 hours but she has been taking every 4 hours due to pain.  She has been using Tylenol intermittently with no relief.  She has continue to elevate her foot and ice.  She has been nonweightbearing.  Denies numbness, tingling, redness, warmth, and calf pain.  Pain is currently a 7 out of 10.  Has follow-up with orthopedic surgeon, Dr. Lorin Mercy, tomorrow.  No other questions or concerns today.  Review of Systems  Per HPI  Patient Active Problem List   Diagnosis Date Noted  . Infertility, female 03/20/2015    Current Outpatient Medications on File Prior to Visit  Medication Sig Dispense Refill  . acetaminophen (TYLENOL) 500 MG tablet Take 2 tablets (1,000 mg total) by mouth every 8 (eight) hours for 5 days. Do not take more than 4000 mg of acetaminophen (Tylenol) in a 24-hour period. Please note that other medicines that you may be prescribed may have Tylenol as well. 30 tablet 0  . cyclobenzaprine (FLEXERIL) 5 MG tablet Take 1 tablet (5 mg total) by mouth 3 (three) times daily as needed for muscle spasms. 60 tablet 0  . FLUoxetine (PROZAC) 40 MG capsule Take 1 capsule (40 mg total) by mouth daily. 90 capsule 1  . HYDROcodone-acetaminophen (NORCO/VICODIN) 5-325 MG tablet Take 1 tablet by mouth every 8 (eight) hours as needed for up to 5 days for severe pain (That is not improved by your scheduled acetaminophen regimen). Please do not exceed 4000 mg of acetaminophen (Tylenol) a 24-hour period. Please note that he may be prescribed additional medicine that contains acetaminophen. 15 tablet 0  .  ibuprofen (ADVIL,MOTRIN) 200 MG tablet Take 400 mg by mouth every 6 (six) hours as needed for moderate pain.    . Multiple Vitamin (MULTIVITAMIN WITH MINERALS) TABS tablet Take 1 tablet by mouth daily.     No current facility-administered medications on file prior to visit.     No Known Allergies   Objective:  BP (!) 151/90   Pulse (!) 112   Temp 99 F (37.2 C) (Oral)   Resp 18   LMP 08/31/2018   SpO2 95%   Physical Exam  Constitutional: She is oriented to person, place, and time. She appears well-developed and well-nourished.  HENT:  Head: Normocephalic and atraumatic.  Eyes: Conjunctivae are normal.  Neck: Normal range of motion.  Pulmonary/Chest: Effort normal.  Musculoskeletal:       Right foot: There is normal capillary refill.  Right lower extremity with splint in place. Sensation of right toes intact.   Neurological: She is alert and oriented to person, place, and time.  Skin: Skin is warm and dry.  Psychiatric: She has a normal mood and affect.  Vitals reviewed.   Assessment and Plan :  1. Closed bimalleolar fracture of right ankle, initial encounter 2. Closed avulsion fracture of medial malleolus of right tibia, initial encounter Pt has been taking norco every 4 hours for pain. Not gaining any relief with tylenol. Has not tried any ibuprofen. Recommend adding ibuprofen 600-800mg  every 6 hours to regimen to aid  with inflammation and pain control and hopefully decrease the amount of norco pt needs. Rec alternating between tylenol and ibuprofen every 3 hours and take norco only for breakthrough pain.  Will provide pt with additional 12 tablets of norco. She is neurovascularly intact. Has f/u with orthopedics tomorrow.  Meds ordered this encounter  Medications  . HYDROcodone-acetaminophen (NORCO/VICODIN) 5-325 MG tablet    Sig: Take 1 tablet by mouth every 4 (four) hours as needed for up to 2 days for severe pain (That is not improved by your scheduled acetaminophen  regimen). Please do not exceed 4000 mg of acetaminophen (Tylenol) a 24-hour period. Please note that he may be prescribed additional medicine that contains acetaminophen.    Dispense:  12 tablet    Refill:  0    Order Specific Question:   Supervising Provider    Answer:   Horald Pollen [[id number]]     Tenna Delaine PA-C  [facility] 09/03/2018 12:24 PM

## 2018-09-03 NOTE — Patient Instructions (Addendum)
For pain, alternate between ibuprofen and tylenol every 3 hours. Start with ibuprofen 600 mg and tylenol 1000mg . You can increase ibuprofen to 800mg  if needed. Use norco for breakthrough pain, recommended at night time. Do not exceed 3200mg  of ibuprofen in 24 hour period or 4000mg  of tylenol in 24 hour period. Follow up with orthopedics tomorrow.   If you have lab work done today you will be contacted with your lab results within the next 2 weeks.  If you have not heard from [place] then please contact us. The fastest [place] to get your results is to register for My Chart.   IF you received an x-ray today, you will receive an invoice from [facility]. Please contact [facility] at [phone] with questions or concerns regarding your invoice.   IF you received labwork today, you will receive an invoice from [facility]. Please contact [facility] at [phone] with questions or concerns regarding your invoice.   Our billing staff will not be able to assist you with questions regarding bills from these companies.  You will be contacted with the lab results as soon as they are available. The fastest [place] to get your results is to activate your My Chart account. Instructions are located on the last page of this paperwork. If you have not heard from [place] regarding the results in 2 weeks, please contact this office.

## 2018-09-05 ENCOUNTER — Ambulatory Visit (INDEPENDENT_AMBULATORY_CARE_PROVIDER_SITE_OTHER): Payer: Commercial Managed Care - PPO | Admitting: Orthopaedic Surgery

## 2018-09-05 ENCOUNTER — Encounter (INDEPENDENT_AMBULATORY_CARE_PROVIDER_SITE_OTHER): Payer: Self-pay | Admitting: Orthopaedic Surgery

## 2018-09-05 VITALS — BP 141/92 | HR 104 | Ht 62.0 in | Wt 187.0 lb

## 2018-09-05 DIAGNOSIS — S82841A Displaced bimalleolar fracture of right lower leg, initial encounter for closed fracture: Secondary | ICD-10-CM | POA: Diagnosis not present

## 2018-09-05 NOTE — Progress Notes (Signed)
Office Visit Note   Patient: Becky Jensen           Date of Birth: 1971/07/30           MRN: [id number] Visit Date: 09/05/2018              Requested by: Leonie Douglas, PA-C [place],  [address] PCP: Leonie Douglas, PA-C   Assessment & Plan: Visit Diagnoses:  1. Bimalleolar ankle fracture, right, closed, initial encounter     Plan: Patient has an unstable bimalleolar ankle fracture with medial lateral malleolar fracture.  We will schedule her for ORIF medial lateral malleolus as an outpatient with likely popliteal block, LMA tube placement with general anesthesia.  Outpatient procedure with possible overnight stay only if the block did not work which is unlikely.  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  She is anxious to get back to work as soon as she can.  Plan procedure discussed risks of surgery discussed questions were elicited and answered.  Patient husband is at the bedside and was included in the discussion.  Patient requests we proceed.  Follow-Up Instructions: No follow-ups on file.   Orders:  No orders of the defined types were placed in this encounter.  No orders of the defined types were placed in this encounter.     Procedures: No procedures performed   Clinical Data: No additional findings.   Subjective: Chief Complaint  Patient presents with  . Right Ankle - Fracture    DOI 08/31/18    HPI 47 year old female with history of some right ankle sprains stepped down twisting her ankle fell backwards hit her head and then fell down into the yard into the mulch.  She had immediate right ankle pain.  She had reduction of bimalleolar closed ankle injury placed in a splint and is seen for follow-up.  She is on crutches nonweightbearing.  Review of Systems 14 point review of systems positive history of depression,  hyperlipidemia.  Previous right ankle sprains.   Objective: Vital Signs: BP (!) 141/92   Pulse (!) 104   Ht 5\' 2"  (1.575 m)   Wt 187 lb (84.8 kg)   LMP 08/31/2018   BMI 34.20 kg/m   Physical Exam  Constitutional: She is oriented to person, place, and time. She appears well-developed.  HENT:  Head: Normocephalic.  Right Ear: External ear normal.  Left Ear: External ear normal.  Eyes: Pupils are equal, round, and reactive to light.  Neck: No tracheal deviation present. No thyromegaly present.  Cardiovascular: Normal rate and regular rhythm. Exam reveals no friction rub.  No murmur heard. Pulmonary/Chest: Effort normal and breath sounds normal. She has no wheezes.  Abdominal: Soft. Bowel sounds are normal. She exhibits no distension. There is no tenderness.  Neurological: She is alert and oriented to person, place, and time.  Skin: Skin is warm and dry.  Psychiatric: She has a normal mood and affect. Her behavior is normal.    Ortho Exam right short leg well-padded splint.  Sensation of the toes are intact.  Knee exam is normal.  Specialty Comments:  No specialty comments available.  Imaging: Study Result   CLINICAL DATA:  Right ankle pain following a twisting injury.  EXAM: RIGHT ANKLE - COMPLETE 3+ VIEW  COMPARISON:  None.  FINDINGS: Fracture of the medial malleolus  with significant lateral displacement of the distal fragment as well as lateral subluxation of the talus relative to the tibia. There is also a lateral malleolus fracture with 1/2 shaft width of lateral displacement of the distal fragment. There is also lateral angulation of the distal fragments. The posterior malleolus appears intact except for the possibility of a small curvilinear avulsion fracture. No effusion is seen. Diffuse soft tissue swelling.  IMPRESSION: 1. Bimalleolar fracture/subluxation, as described above. 2. Possible small curvilinear avulsion fracture off the posterior aspect of  the posterior malleolus.   Electronically Signed   By: Claudie Revering M.D.   On: 08/31/2018 23:01       PMFS History: Patient Active Problem List   Diagnosis Date Noted  . Bimalleolar ankle fracture, right, closed, initial encounter 09/05/2018  . Infertility, female 03/20/2015   Past Medical History:  Diagnosis Date  . Depression   . Hyperlipidemia     Family History  Problem Relation Age of Onset  . Hyperlipidemia Mother   . Hypertension Father   . Cancer Maternal Grandmother   . Heart disease Maternal Grandfather   . Cancer Paternal Grandmother   . Hypertension Paternal Grandfather   . Stroke Paternal Grandfather   . Breast cancer Maternal Aunt   . Breast cancer Cousin     Past Surgical History:  Procedure Laterality Date  . TONSILLECTOMY     Social History   Occupational History  . 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 Smoking status: Never Smoker  . Smokeless tobacco: Never Used  Substance and Sexual Activity  . Alcohol use: Yes    Comment: 10 drinks per week  . Drug use: No  . Sexual activity: Yes    Partners: Male    Birth control/protection: None

## 2018-09-09 IMAGING — CR DG FOOT COMPLETE 3+V*R*
3 series · 3 of 3 positions shown · non-contrast
Comparison: Right ankle obtained at the same time.

CLINICAL DATA: Right foot pain following a twisting injury.

EXAM:
RIGHT FOOT COMPLETE - 3+ VIEW

[x foot lat right]
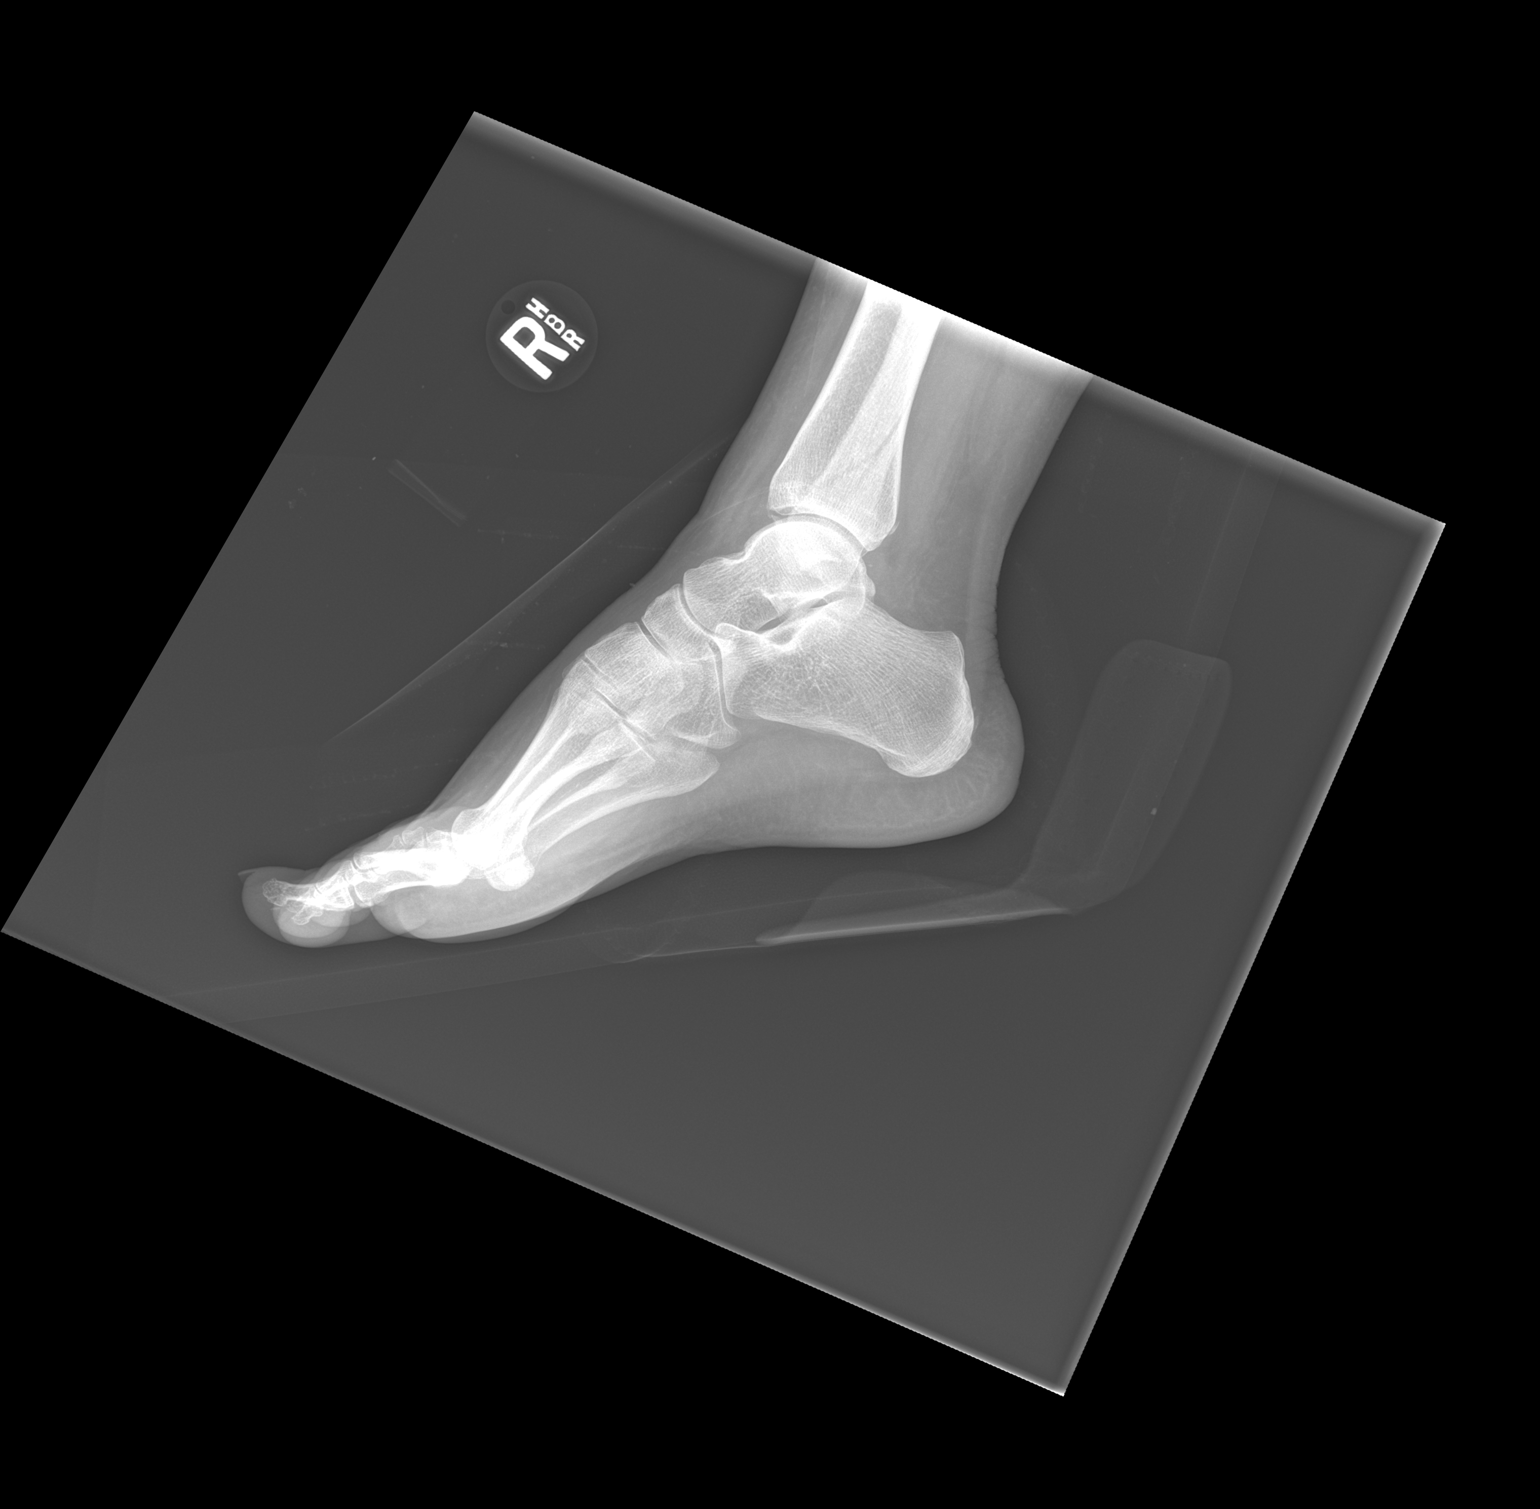

[x foot ap right]
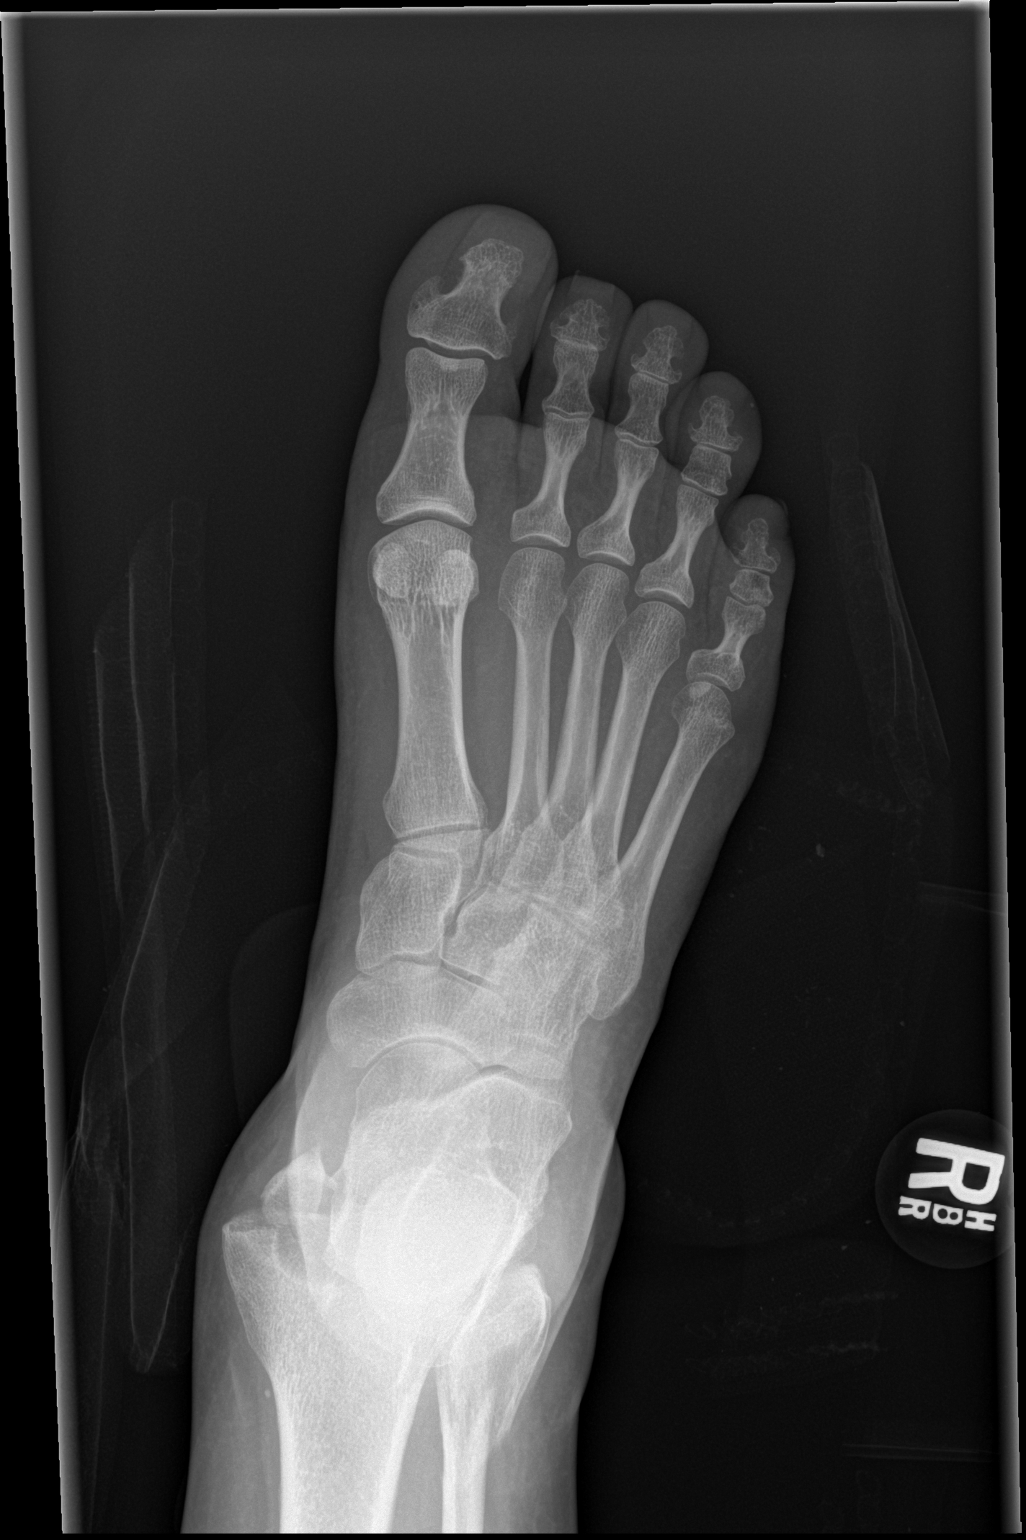

[x foot obl right]
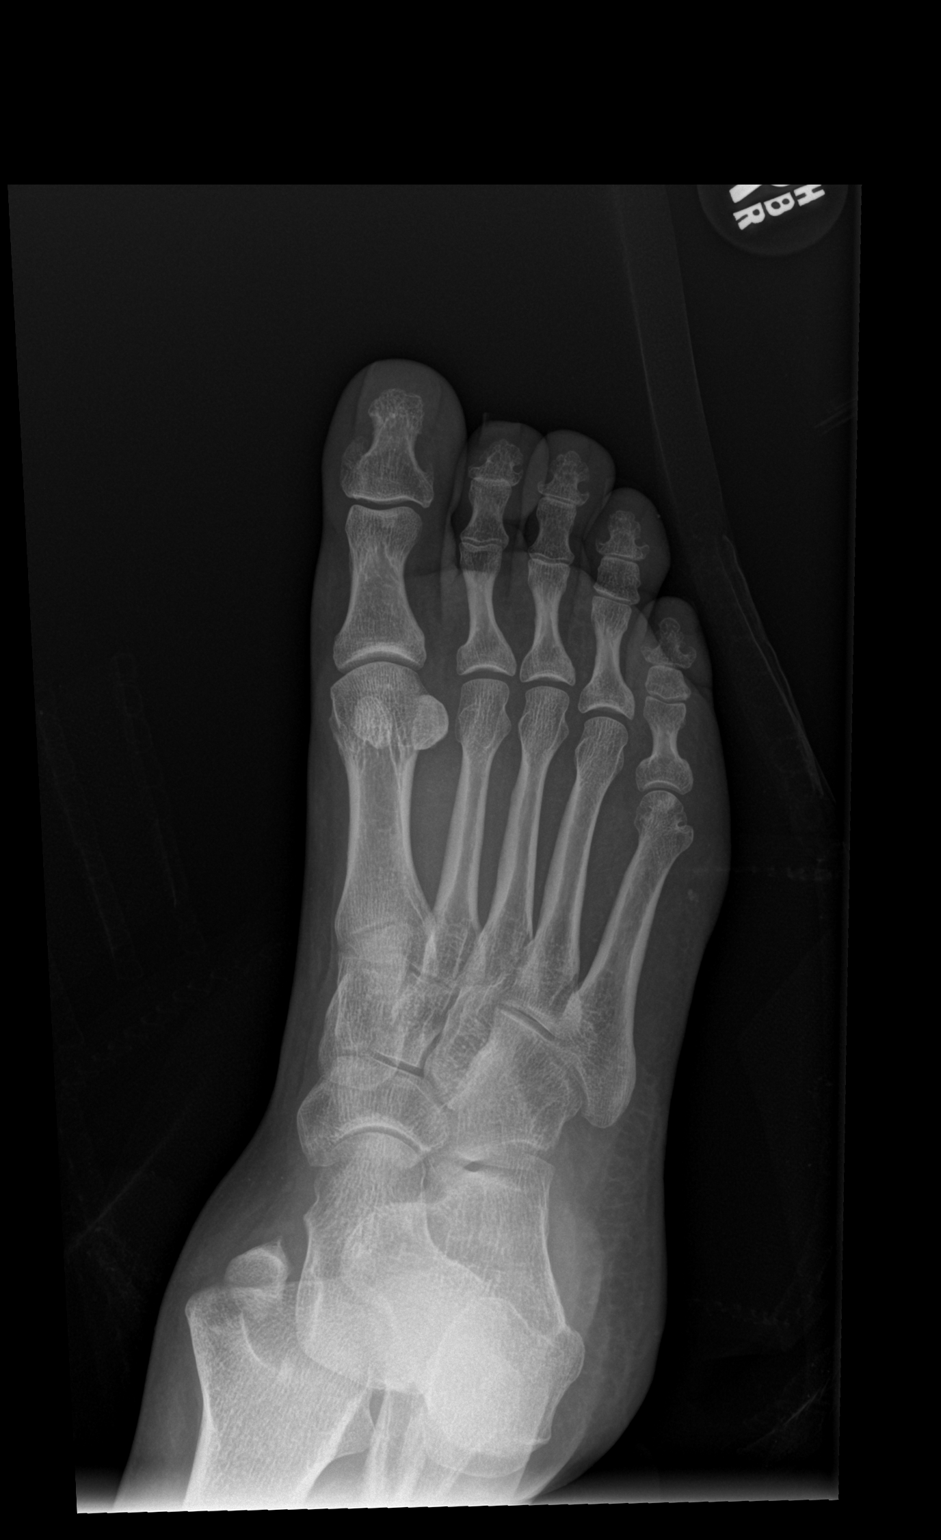

[3 of 3 positions shown; findings below may reference images not displayed]

FINDINGS: Previously described bimalleolar fracture. A small avulsion fracture
off the posterior aspect of the posterior malleolus is confirmed on
these images. No foot fracture or dislocation seen
IMPRESSION: 1. Previously demonstrated bimalleolar fracture/subluxation.
2. Small linear avulsion fracture off the posterior aspect of the
posterior malleolus.
3. No foot fracture or dislocation.

## 2018-09-10 DIAGNOSIS — G8918 Other acute postprocedural pain: Secondary | ICD-10-CM | POA: Diagnosis not present

## 2018-09-10 DIAGNOSIS — S82841A Displaced bimalleolar fracture of right lower leg, initial encounter for closed fracture: Secondary | ICD-10-CM | POA: Diagnosis not present

## 2018-09-18 ENCOUNTER — Ambulatory Visit (INDEPENDENT_AMBULATORY_CARE_PROVIDER_SITE_OTHER): Payer: Commercial Managed Care - PPO

## 2018-09-18 ENCOUNTER — Encounter (INDEPENDENT_AMBULATORY_CARE_PROVIDER_SITE_OTHER): Payer: Self-pay | Admitting: Orthopaedic Surgery

## 2018-09-18 ENCOUNTER — Ambulatory Visit (INDEPENDENT_AMBULATORY_CARE_PROVIDER_SITE_OTHER): Payer: Commercial Managed Care - PPO | Admitting: Orthopaedic Surgery

## 2018-09-18 DIAGNOSIS — S82841A Displaced bimalleolar fracture of right lower leg, initial encounter for closed fracture: Secondary | ICD-10-CM

## 2018-09-18 MED ORDER — OXYCODONE-ACETAMINOPHEN 5-325 MG PO TABS: ORAL_TABLET | ORAL | 0 refills | Status: DC

## 2018-09-18 NOTE — Progress Notes (Signed)
   Post-Op Visit Note   Patient: Becky Jensen           Date of Birth: 1971-01-13           MRN: [id number] Visit Date: 09/18/2018 PCP: Tenna Delaine D, PA-C   Assessment & Plan: Postop right bimalleolar ankle fracture fixation.  Medial malleolus fragment was small.  She has slight valgus of the mortise which is symmetrical with her opposite ankle that previously was x-rayed several years ago.  Syndesmosis appears reduced.  Staples are left in for 1 week she will return in 1 week for staple removal and short leg fiberglass cast.  Percocet was renewed.  Chief Complaint:  Chief Complaint  Patient presents with  . Left Ankle - Routine Post Op    09/10/18  ORIF Right Bimalleolar Fracture   Visit Diagnoses:  1. Bimalleolar ankle fracture, right, closed, initial encounter     Plan: She is been using half Percocet tablets since the Norco made her itch.  She is also using ibuprofen.  New prescription for Percocet 5/325 1/2 tablet p.o. every 6 as needed #20 prescribed.  Cam boot applied after wrap.  Return 1 week for cam boot removal staple harvesting Steri-Strips and application of a short leg fiberglass cast nonweightbearing.  Follow-Up Instructions: No follow-ups on file.   Orders:  Orders Placed This Encounter  Procedures  . XR Ankle Complete Right   No orders of the defined types were placed in this encounter.   Imaging: No results found.  PMFS History: Patient Active Problem List   Diagnosis Date Noted  . Bimalleolar ankle fracture, right, closed, initial encounter 09/05/2018  . Infertility, female 03/20/2015   Past Medical History:  Diagnosis Date  . Depression   . Hyperlipidemia     Family History  Problem Relation Age of Onset  . Hyperlipidemia Mother   . Hypertension Father   . Cancer Maternal Grandmother   . Heart disease Maternal Grandfather   . Cancer Paternal Grandmother   . Hypertension Paternal Grandfather   . Stroke Paternal Grandfather   . Breast  cancer Maternal Aunt   . Breast cancer Cousin     Past Surgical History:  Procedure Laterality Date  . TONSILLECTOMY     Social History   Occupational History  . 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 Smoking status: Never Smoker  . Smokeless tobacco: Never Used  Substance and Sexual Activity  . Alcohol use: Yes    Comment: 10 drinks per week  . Drug use: No  . Sexual activity: Yes    Partners: Male    Birth control/protection: None

## 2018-09-25 ENCOUNTER — Encounter (INDEPENDENT_AMBULATORY_CARE_PROVIDER_SITE_OTHER): Payer: Self-pay | Admitting: Orthopaedic Surgery

## 2018-09-25 ENCOUNTER — Ambulatory Visit (INDEPENDENT_AMBULATORY_CARE_PROVIDER_SITE_OTHER): Payer: Commercial Managed Care - PPO | Admitting: Orthopaedic Surgery

## 2018-09-25 VITALS — BP 154/86 | HR 109 | Ht 62.0 in | Wt 187.0 lb

## 2018-09-25 DIAGNOSIS — S82841A Displaced bimalleolar fracture of right lower leg, initial encounter for closed fracture: Secondary | ICD-10-CM

## 2018-09-25 NOTE — Progress Notes (Signed)
   Post-Op Visit Note   Patient: Becky Jensen           Date of Birth: 1971-10-10           MRN: [id number] Visit Date: 09/25/2018 PCP: Leonie Douglas, PA-C   Assessment & Plan: Postop right bimalleolar ankle fracture fixation 09/10/2018.  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  Staples are harvested Steri-Strips applied.  Continue cam boot.  She can remove it once a day to wash her foot.  Chief Complaint:  Chief Complaint  Patient presents with  . Left Ankle - Follow-up    09/10/18 ORIF Bimalleolar Fracture   Visit Diagnoses:  1. Bimalleolar ankle fracture, right, closed, initial encounter     Plan: Return 4 weeks repeat x-rays right ankle on return.  She will continue with the cam boot.  Staples harvested today.  She is using ibuprofen for pain.  Follow-Up Instructions: No follow-ups on file.   Orders:  No orders of the defined types were placed in this encounter.  No orders of the defined types were placed in this encounter.   Imaging: No results found.  PMFS History: Patient Active Problem List   Diagnosis Date Noted  . Bimalleolar ankle fracture, right, closed, initial encounter 09/05/2018  . Infertility, female 03/20/2015   Past Medical History:  Diagnosis Date  . Depression   . Hyperlipidemia     Family History  Problem Relation Age of Onset  . Hyperlipidemia Mother   . Hypertension Father   . Cancer Maternal Grandmother   . Heart disease Maternal Grandfather   . Cancer Paternal Grandmother   . Hypertension Paternal Grandfather   . Stroke Paternal Grandfather   . Breast cancer Maternal Aunt   . Breast cancer Cousin     Past Surgical History:  Procedure Laterality Date  . TONSILLECTOMY     Social History   Occupational History  . 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 Smoking status: Never Smoker  . Smokeless tobacco: Never Used  Substance and Sexual Activity  . Alcohol use: Yes   Comment: 10 drinks per week  . Drug use: No  . Sexual activity: Yes    Partners: Male    Birth control/protection: None

## 2018-10-01 DIAGNOSIS — Z23 Encounter for immunization: Secondary | ICD-10-CM | POA: Diagnosis not present

## 2018-10-23 ENCOUNTER — Ambulatory Visit (INDEPENDENT_AMBULATORY_CARE_PROVIDER_SITE_OTHER): Payer: Commercial Managed Care - PPO

## 2018-10-23 ENCOUNTER — Ambulatory Visit (INDEPENDENT_AMBULATORY_CARE_PROVIDER_SITE_OTHER): Payer: Commercial Managed Care - PPO | Admitting: Orthopaedic Surgery

## 2018-10-23 ENCOUNTER — Encounter (INDEPENDENT_AMBULATORY_CARE_PROVIDER_SITE_OTHER): Payer: Self-pay | Admitting: Orthopaedic Surgery

## 2018-10-23 VITALS — BP 143/91 | HR 80 | Ht 62.0 in | Wt 187.0 lb

## 2018-10-23 DIAGNOSIS — S82841A Displaced bimalleolar fracture of right lower leg, initial encounter for closed fracture: Secondary | ICD-10-CM

## 2018-10-23 NOTE — Progress Notes (Signed)
   Post-Op Visit Note   Patient: Becky Jensen           Date of Birth: [birth year]           MRN: [id number] Visit Date: 10/23/2018 PCP: Tenna Delaine D, PA-C   Assessment & Plan: Postop bimalleolar ankle fracture right.  We will allow her to gradually progressive weightbearing with her walker at home.  She can work on ankle range of motion.  Chief Complaint:  Chief Complaint  Patient presents with  . Right Ankle - Follow-up    09/10/18 ORIF Right Bimalleolar Fracture   Visit Diagnoses:  1. Bimalleolar ankle fracture, right, closed, initial encounter     Plan: Ankle range of motion.  Progressive weightbearing as tolerated.  Return 1 month.  Three-view x-ray right ankle on return.  Follow-Up Instructions: Return in about 1 month (around 11/23/2018).   Orders:  Orders Placed This Encounter  Procedures  . XR Ankle Complete Right   No orders of the defined types were placed in this encounter.   Imaging: No results found.  PMFS History: Patient Active Problem List   Diagnosis Date Noted  . Bimalleolar ankle fracture, right, closed, initial encounter 09/05/2018  . Infertility, female 03/20/2015   Past Medical History:  Diagnosis Date  . Depression   . Hyperlipidemia     Family History  Problem Relation Age of Onset  . Hyperlipidemia Mother   . Hypertension Father   . Cancer Maternal Grandmother   . Heart disease Maternal Grandfather   . Cancer Paternal Grandmother   . Hypertension Paternal Grandfather   . Stroke Paternal Grandfather   . Breast cancer Maternal Aunt   . Breast cancer Cousin     Past Surgical History:  Procedure Laterality Date  . TONSILLECTOMY     Social History   Occupational History  . 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 Smoking status: Never Smoker  . Smokeless tobacco: Never Used  Substance and Sexual Activity  . Alcohol use: Yes    Comment: 10 drinks per week  . Drug use: No  . Sexual activity: Yes      Partners: Male    Birth control/protection: None

## 2018-11-26 ENCOUNTER — Encounter: Payer: Self-pay | Admitting: *Deleted

## 2018-11-27 ENCOUNTER — Ambulatory Visit (INDEPENDENT_AMBULATORY_CARE_PROVIDER_SITE_OTHER): Payer: Commercial Managed Care - PPO | Admitting: Orthopaedic Surgery

## 2018-11-27 ENCOUNTER — Encounter (INDEPENDENT_AMBULATORY_CARE_PROVIDER_SITE_OTHER): Payer: Self-pay | Admitting: Orthopaedic Surgery

## 2018-11-27 ENCOUNTER — Ambulatory Visit (INDEPENDENT_AMBULATORY_CARE_PROVIDER_SITE_OTHER): Payer: Commercial Managed Care - PPO

## 2018-11-27 VITALS — BP 134/82 | HR 92 | Ht 62.0 in | Wt 187.0 lb

## 2018-11-27 DIAGNOSIS — S82841A Displaced bimalleolar fracture of right lower leg, initial encounter for closed fracture: Secondary | ICD-10-CM

## 2018-11-27 NOTE — Progress Notes (Signed)
Post-ORIF bimalleolar ankle fracture, right.  X-rays today demonstrates healing of the fibula.  There may be slight widening of the syndesmosis and medial malleolar fracture shows some delayed healing with slight displacement.  She is walking in a tennis shoe using one crutch sometimes walks in the house without a crutch.  I plan to recheck her in 2 months.  She is back working a Radio producer.

## 2018-12-26 DIAGNOSIS — C4490 Unspecified malignant neoplasm of skin, unspecified: Secondary | ICD-10-CM | POA: Insufficient documentation

## 2018-12-27 DIAGNOSIS — C44319 Basal cell carcinoma of skin of other parts of face: Secondary | ICD-10-CM | POA: Diagnosis not present

## 2018-12-27 DIAGNOSIS — L918 Other hypertrophic disorders of the skin: Secondary | ICD-10-CM | POA: Diagnosis not present

## 2018-12-27 DIAGNOSIS — L821 Other seborrheic keratosis: Secondary | ICD-10-CM | POA: Diagnosis not present

## 2018-12-27 DIAGNOSIS — D2261 Melanocytic nevi of right upper limb, including shoulder: Secondary | ICD-10-CM | POA: Diagnosis not present

## 2018-12-27 DIAGNOSIS — D485 Neoplasm of uncertain behavior of skin: Secondary | ICD-10-CM | POA: Diagnosis not present

## 2019-01-16 ENCOUNTER — Telehealth (INDEPENDENT_AMBULATORY_CARE_PROVIDER_SITE_OTHER): Payer: Self-pay | Admitting: Orthopaedic Surgery

## 2019-01-16 NOTE — Telephone Encounter (Signed)
Returned call to patient left voicemail message concerning needing an antibiotic for dental procedure. I advised patient per dental antibiotic prophylaxis protocol  Patient will not need an antibiotic to have dental procedures

## 2019-01-26 ENCOUNTER — Other Ambulatory Visit: Payer: Self-pay | Admitting: Physician Assistant

## 2019-01-26 DIAGNOSIS — F329 Major depressive disorder, single episode, unspecified: Secondary | ICD-10-CM

## 2019-01-26 DIAGNOSIS — F32A Depression, unspecified: Secondary | ICD-10-CM

## 2019-01-26 DIAGNOSIS — F419 Anxiety disorder, unspecified: Principal | ICD-10-CM

## 2019-01-28 NOTE — Telephone Encounter (Signed)
Appt due in 2 months  Requested Prescriptions  Pending Prescriptions Disp Refills  . FLUoxetine (PROZAC) 40 MG capsule [Pharmacy Med Name: FLUOXETINE 40MG  CAPSULES] 90 capsule 1    Sig: TAKE 1 CAPSULE(40 MG) BY MOUTH DAILY     Psychiatry:  Antidepressants - SSRI Passed - 01/26/2019  3:44 AM      Passed - Valid encounter within last 6 months    Recent Outpatient Visits          4 months ago Closed bimalleolar fracture of right ankle, initial encounter   [facility], [place] D, PA-C   6 months ago Annual physical exam   Primary Care at [place], [place] D, PA-C   7 months ago Anxiety and depression   Primary [facility] at [place], [place] D, PA-C   1 year ago Depression, unspecified depression type   [facility] at [place], [place] D, PA-C   1 year ago Moderate major depression [facility])   [facility] at [facility], Audrie Lia, PA-C      Future Appointments            Tomorrow Marybelle Killings, MD [facility]

## 2019-01-29 ENCOUNTER — Encounter (INDEPENDENT_AMBULATORY_CARE_PROVIDER_SITE_OTHER): Payer: Self-pay | Admitting: Orthopaedic Surgery

## 2019-01-29 ENCOUNTER — Ambulatory Visit (INDEPENDENT_AMBULATORY_CARE_PROVIDER_SITE_OTHER): Payer: Commercial Managed Care - PPO | Admitting: Orthopaedic Surgery

## 2019-01-29 ENCOUNTER — Ambulatory Visit (INDEPENDENT_AMBULATORY_CARE_PROVIDER_SITE_OTHER): Payer: Commercial Managed Care - PPO

## 2019-01-29 VITALS — BP 134/83 | HR 80 | Ht 62.0 in | Wt 190.0 lb

## 2019-01-29 DIAGNOSIS — S82841A Displaced bimalleolar fracture of right lower leg, initial encounter for closed fracture: Secondary | ICD-10-CM | POA: Diagnosis not present

## 2019-01-29 DIAGNOSIS — C44319 Basal cell carcinoma of skin of other parts of face: Secondary | ICD-10-CM | POA: Diagnosis not present

## 2019-01-29 DIAGNOSIS — Z85828 Personal history of other malignant neoplasm of skin: Secondary | ICD-10-CM | POA: Diagnosis not present

## 2019-01-29 NOTE — Progress Notes (Signed)
48 year old white female who is almost 5 months status post ORIF bimalleolar right ankle fracture returns.  States that she is doing well.  Continues have some soreness in the tibiotalar joint but feels like she is recovering well from her surgery.  Some soreness at the end of the day.  She is a Radio producer.   Exam She has mild mildly tender over the anterior tibiotalar joint line.  Mild ankle swelling.  She does have decreased ankle dorsiflexion and I can get her to about 0 degrees although she is tight on the left side as well.  Bilateral calves nontender.  Neurovascular intact.   Plan Advised patient for the soreness that she is describing I could try conservative treatment with right ankle intra-articular Marcaine/Depo-Medrol injection.  We will give this a little more time to see how she does.  Follow-up in 6 weeks for recheck.  If she still continues to describe having the same discomfort we will attempt injection at that time but if she is doing well she may cancel the appointment.  Also recommend that she do stretching exercises.  Patient agrees with treatment plan that has been given.  All questions answered.

## 2019-03-12 ENCOUNTER — Ambulatory Visit (INDEPENDENT_AMBULATORY_CARE_PROVIDER_SITE_OTHER): Payer: Commercial Managed Care - PPO | Admitting: Orthopaedic Surgery

## 2019-05-02 ENCOUNTER — Other Ambulatory Visit: Payer: Self-pay | Admitting: Family Medicine

## 2019-05-02 DIAGNOSIS — F419 Anxiety disorder, unspecified: Principal | ICD-10-CM

## 2019-05-02 DIAGNOSIS — F32A Depression, unspecified: Secondary | ICD-10-CM

## 2019-05-02 DIAGNOSIS — F329 Major depressive disorder, single episode, unspecified: Secondary | ICD-10-CM

## 2019-06-05 ENCOUNTER — Other Ambulatory Visit: Payer: Self-pay | Admitting: Physician Assistant

## 2019-06-05 DIAGNOSIS — F419 Anxiety disorder, unspecified: Secondary | ICD-10-CM

## 2019-06-05 DIAGNOSIS — F32A Depression, unspecified: Secondary | ICD-10-CM

## 2019-06-05 DIAGNOSIS — F329 Major depressive disorder, single episode, unspecified: Secondary | ICD-10-CM

## 2019-06-05 NOTE — Telephone Encounter (Signed)
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

## 2019-06-06 NOTE — Telephone Encounter (Signed)
Copied from [facility] [id number]. Topic: Quick Communication - Rx Refill/Question >> Jun 06, 2019  2:04 PM Leward Quan A wrote: Medication: FLUoxetine (PROZAC) 40 MG capsule  Has the patient contacted their pharmacy? Yes.   (Agent: If no, request that the patient contact the pharmacy for the refill.) (Agent: If yes, when and what did the pharmacy advise?)  Preferred Pharmacy (with phone number or street name): [facility] [place] [phone] (Phone) [phone] (Fax)    Agent: Please be advised that RX refills may take up to 3 business days. We ask that you follow-up with your pharmacy.

## 2019-06-06 NOTE — Telephone Encounter (Signed)
Spoke to patient and made appnt

## 2019-06-06 NOTE — Telephone Encounter (Signed)
Pt needs an appointment to establish care with a new provider for refills on medication. Last OV was 09/03/18. Last refill was 05/02/19. 30 cap, no refills.

## 2019-06-10 ENCOUNTER — Encounter: Payer: Self-pay | Admitting: Registered Nurse

## 2019-06-10 ENCOUNTER — Other Ambulatory Visit: Payer: Self-pay

## 2019-06-10 ENCOUNTER — Ambulatory Visit: Payer: Commercial Managed Care - PPO | Admitting: Registered Nurse

## 2019-06-10 VITALS — BP 136/86 | HR 96 | Temp 99.0°F | Resp 16 | Ht 62.0 in | Wt 202.0 lb

## 2019-06-10 DIAGNOSIS — Z13220 Encounter for screening for lipoid disorders: Secondary | ICD-10-CM | POA: Diagnosis not present

## 2019-06-10 DIAGNOSIS — Z1329 Encounter for screening for other suspected endocrine disorder: Secondary | ICD-10-CM

## 2019-06-10 DIAGNOSIS — R41840 Attention and concentration deficit: Secondary | ICD-10-CM

## 2019-06-10 DIAGNOSIS — F329 Major depressive disorder, single episode, unspecified: Secondary | ICD-10-CM

## 2019-06-10 DIAGNOSIS — F419 Anxiety disorder, unspecified: Secondary | ICD-10-CM

## 2019-06-10 DIAGNOSIS — F32A Depression, unspecified: Secondary | ICD-10-CM

## 2019-06-10 DIAGNOSIS — Z13228 Encounter for screening for other metabolic disorders: Secondary | ICD-10-CM

## 2019-06-10 DIAGNOSIS — Z130 Encounter for screening for diseases of the blood and blood-forming organs and certain disorders involving the immune mechanism: Secondary | ICD-10-CM

## 2019-06-10 MED ORDER — FLUOXETINE HCL 40 MG PO CAPS: ORAL_CAPSULE | ORAL | 3 refills | Status: DC

## 2019-06-10 NOTE — Progress Notes (Signed)
Established Patient Office Visit  Subjective:  Patient ID: Becky Jensen, female    DOB: [birth year]  Age: 48 y.o. MRN: [id number]  CC:  Chief Complaint  Patient presents with  . Transitions Of Care    need new PCP to manage care and medications  . Medication Refill    HPI Becky Jensen presents for visit to establish care and discuss medication refills.  Pt taking Fluoxetine 40mg  PO qd for anxiety/depression with good effect. Hopes to stay on this medication at this dose. States that it has helped her tremendously since she broke her ankle last September and was homebound and during quarantine when her social distancing and wfh lifestyle have left her feeling limited.  Pt also reports that she is concerned for symptoms of ADD/ADHD. She states that she has had disorganized thoughts and actions, trouble focusing, trouble sitting still, and trouble keeping her home organized. She formerly thought this was her depression, however, with the fluoxetine on board and providing good effect, she requests a workup for ADHD.   Routine labs today.   Past Medical History:  Diagnosis Date  . Depression   . Hyperlipidemia     Past Surgical History:  Procedure Laterality Date  . ANKLE FRACTURE SURGERY Right 08/2018  . TONSILLECTOMY      Family History  Problem Relation Age of Onset  . Hyperlipidemia Mother   . Hypertension Father   . Cancer Maternal Grandmother 80       stomach  . Heart disease Maternal Grandfather   . Cancer Paternal Grandmother        NH lymphoma  . Hypertension Paternal Grandfather   . Stroke Paternal Grandfather   . Breast cancer Maternal Aunt   . Breast cancer Cousin     Social History   Socioeconomic History  . Marital status: Married    Spouse name: Not on file  . Number of children: 0  . Years of education: Not on file  . Highest education level: Not on file  Occupational History  . 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  Financial resource strain: Not hard at all  . Food insecurity    Worry: Never true    Inability: Never true  . Transportation needs    Medical: No    Non-medical: No  Tobacco Use  . Smoking status: Former Smoker    Quit date: 06/09/2009    Years since quitting: 10.0  . Smokeless tobacco: Never Used  . Tobacco comment: sporadic  Substance and Sexual Activity  . Alcohol use: Yes    Comment: 10 drinks per week  . Drug use: No  . Sexual activity: Yes    Partners: Male    Birth control/protection: None  Lifestyle  . Physical activity    Days per week: 5 days    Minutes per session: 30 min  . Stress: Not at all  Relationships  . Social connections    Talks on phone: More than three times a week    Gets together: Once a week    Attends religious service: Never    Active member of club or organization: No    Attends meetings of clubs or organizations: Never    Relationship status: Married  . Intimate partner violence    Fear of current or ex partner: No    Emotionally abused: No    Physically abused: No    Forced sexual activity: No  Other  Topics Concern  . Not on file  Social History Narrative   Pt is from [place]. Moved to [place] in 2004. Lives at home with husband.     Outpatient Medications Prior to Visit  Medication Sig Dispense Refill  . cyclobenzaprine (FLEXERIL) 5 MG tablet Take 1 tablet (5 mg total) by mouth 3 (three) times daily as needed for muscle spasms. 60 tablet 0  . ibuprofen (ADVIL,MOTRIN) 200 MG tablet Take 400 mg by mouth every 6 (six) hours as needed for moderate pain.    . Multiple Vitamin (MULTIVITAMIN WITH MINERALS) TABS tablet Take 1 tablet by mouth daily.    Marland Kitchen FLUoxetine (PROZAC) 40 MG capsule TAKE 1 CAPSULE(40 MG) BY MOUTH DAILY 30 capsule 0  . oxyCODONE-acetaminophen (PERCOCET/ROXICET) 5-325 MG tablet Take 1/2 tablet every 6 hours as needed for pain. (Patient not taking: Reported on 09/25/2018) 20 tablet 0   No facility-administered medications  prior to visit.     No Known Allergies  ROS Review of Systems  Constitutional: Negative.   HENT: Negative.   Eyes: Negative.   Respiratory: Negative.   Cardiovascular: Negative.   Gastrointestinal: Negative.   Endocrine: Negative.   Genitourinary: Negative.   Musculoskeletal: Negative.   Skin: Negative.   Allergic/Immunologic: Negative.   Neurological: Negative.   Hematological: Negative.   Psychiatric/Behavioral: Positive for behavioral problems and decreased concentration. Negative for sleep disturbance and suicidal ideas. The patient is hyperactive. The patient is not nervous/anxious.   All other systems reviewed and are negative.     Objective:    Physical Exam  Constitutional: She is oriented to person, place, and time. She appears well-developed and well-nourished.  Cardiovascular: Normal rate and regular rhythm.  Pulmonary/Chest: Effort normal. No respiratory distress.  Neurological: She is alert and oriented to person, place, and time.  Skin: Skin is warm and dry. No rash noted. No erythema. No pallor.  Psychiatric: She has a normal mood and affect. Her behavior is normal. Judgment and thought content normal.    BP 136/86   Pulse 96   Temp 99 F (37.2 C) (Oral)   Resp 16   Ht 5\' 2"  (1.575 m)   Wt 202 lb (91.6 kg)   LMP 06/03/2019 (Approximate)   SpO2 100%   BMI 36.95 kg/m  Wt Readings from Last 3 Encounters:  06/10/19 202 lb (91.6 kg)  01/29/19 190 lb (86.2 kg)  11/27/18 187 lb (84.8 kg)     There are no preventive care reminders to display for this patient.  There are no preventive care reminders to display for this patient.  Lab Results  Component Value Date   TSH 1.314 03/20/2015   Lab Results  Component Value Date   WBC 8.5 07/16/2018   HGB 14.7 07/16/2018   HCT 43.0 07/16/2018   MCV 95 07/16/2018   PLT 371 07/16/2018   Lab Results  Component Value Date   NA 142 07/16/2018   K 4.9 07/16/2018   CO2 21 07/16/2018   GLUCOSE 107 (H)  07/16/2018   BUN 12 07/16/2018   CREATININE 0.64 07/16/2018   BILITOT 0.3 07/16/2018   ALKPHOS 74 07/16/2018   AST 14 07/16/2018   ALT 11 07/16/2018   PROT 6.9 07/16/2018   ALBUMIN 4.2 07/16/2018   CALCIUM 9.6 07/16/2018   Lab Results  Component Value Date   CHOL 262 (H) 07/16/2018   Lab Results  Component Value Date   HDL 77 07/16/2018   Lab Results  Component Value Date   LDLCALC  161 (H) 07/16/2018   Lab Results  Component Value Date   TRIG 120 07/16/2018   Lab Results  Component Value Date   CHOLHDL 3.4 07/16/2018   Lab Results  Component Value Date   HGBA1C 5.5 07/16/2018      Assessment & Plan:   Problem List Items Addressed This Visit    None    Visit Diagnoses    Screening for endocrine, metabolic and immunity disorder    -  Primary   Relevant Orders   CBC   Comprehensive metabolic panel   Hemoglobin A1c   TSH   Lipid screening       Relevant Orders   Lipid panel   Attention deficit       Relevant Orders   Ambulatory referral to Neurology   Anxiety and depression       Relevant Medications   FLUoxetine (PROZAC) 40 MG capsule      Meds ordered this encounter  Medications  . FLUoxetine (PROZAC) 40 MG capsule    Sig: TAKE 1 CAPSULE(40 MG) BY MOUTH DAILY    Dispense:  90 capsule    Refill:  3    Order Specific Question:   Supervising Provider    Answer:   Forrest Moron [id number]    Follow-up: No follow-ups on file.   PLAN:  Pt referred to Neurology for ADD/ADHD workup - her symptoms seem to be somewhat typical of one of these diagnoses, however, pt would benefit from seeing Neuro. Once diagnosis is made, we discussed that I would be able to manage her medications.  Routine labs today - will follow up as warranted.  Refilled Fluoxetine 40mg  PO qd - 12 mo supply given.   Patient encouraged to call clinic with any questions, comments, or concerns.    Maximiano Coss, NP

## 2019-06-10 NOTE — Patient Instructions (Signed)
° ° ° °  If you have lab work done today you will be contacted with your lab results within the next 2 weeks.  If you have not heard from us then please contact us. The fastest [place] to get your results is to register for My Chart. ° ° °IF you received an x-ray today, you will receive an invoice from [facility]. Please contact [facility] at [phone] with questions or concerns regarding your invoice.  ° °IF you received labwork today, you will receive an invoice from [facility]. Please contact [facility] at [phone] with questions or concerns regarding your invoice.  ° °Our billing staff will not be able to assist you with questions regarding bills from these companies. ° °You will be contacted with the lab results as soon as they are available. The fastest [place] to get your results is to activate your My Chart account. Instructions are located on the last page of this paperwork. If you have not heard from us regarding the results in 2 weeks, please contact this office. °  ° ° ° °

## 2019-06-11 ENCOUNTER — Other Ambulatory Visit: Payer: Self-pay | Admitting: Registered Nurse

## 2019-06-11 DIAGNOSIS — E7801 Familial hypercholesterolemia: Secondary | ICD-10-CM

## 2019-06-11 LAB — COMPREHENSIVE METABOLIC PANEL
ALT: 19 IU/L (ref 0–32)
AST: 23 IU/L (ref 0–40)
Albumin/Globulin Ratio: 1.8 (ref 1.2–2.2)
Albumin: 4.3 g/dL (ref 3.8–4.8)
Alkaline Phosphatase: 86 IU/L (ref 39–117)
BUN/Creatinine Ratio: 15 (ref 9–23)
BUN: 10 mg/dL (ref 6–24)
Bilirubin Total: <0.2 mg/dL (ref 0.0–1.2)
CO2: 22 mmol/L (ref 20–29)
Calcium: 9.5 mg/dL (ref 8.7–10.2)
Chloride: 100 mmol/L (ref 96–106)
Creatinine, Ser: 0.68 mg/dL (ref 0.57–1.00)
GFR calc Af Amer: 120 mL/min/{1.73_m2} (ref >59)
GFR calc non Af Amer: 105 mL/min/{1.73_m2} (ref >59)
Globulin, Total: 2.4 g/dL (ref 1.5–4.5)
Glucose: 88 mg/dL (ref 65–99)
Potassium: 4.4 mmol/L (ref 3.5–5.2)
Sodium: 139 mmol/L (ref 134–144)
Total Protein: 6.7 g/dL (ref 6.0–8.5)

## 2019-06-11 LAB — LIPID PANEL
Chol/HDL Ratio: 5 ratio — ABNORMAL HIGH (ref 0.0–4.4)
Cholesterol, Total: 347 mg/dL — ABNORMAL HIGH (ref 100–199)
HDL: 69 mg/dL (ref >39)
LDL Calculated: 231 mg/dL — ABNORMAL HIGH (ref 0–99)
Triglycerides: 236 mg/dL — ABNORMAL HIGH (ref 0–149)
VLDL Cholesterol Cal: 47 mg/dL — ABNORMAL HIGH (ref 5–40)

## 2019-06-11 LAB — HEMOGLOBIN A1C
Est. average glucose Bld gHb Est-mCnc: 108 mg/dL
Hgb A1c MFr Bld: 5.4 % (ref 4.8–5.6)

## 2019-06-11 LAB — CBC
Hematocrit: 44.3 % (ref 34.0–46.6)
Hemoglobin: 14.7 g/dL (ref 11.1–15.9)
MCH: 32.3 pg (ref 26.6–33.0)
MCHC: 33.2 g/dL (ref 31.5–35.7)
MCV: 97 fL (ref 79–97)
Platelets: 359 10*3/uL (ref 150–450)
RBC: 4.55 x10E6/uL (ref 3.77–5.28)
RDW: 12.3 % (ref 11.7–15.4)
WBC: 7.9 10*3/uL (ref 3.4–10.8)

## 2019-06-11 LAB — TSH: TSH: 2.82 u[IU]/mL (ref 0.450–4.500)

## 2019-06-11 MED ORDER — ATORVASTATIN CALCIUM 20 MG PO TABS: 20.0000 mg | ORAL_TABLET | Freq: Every day | ORAL | 3 refills | Status: DC

## 2019-06-11 NOTE — Progress Notes (Signed)
Called patient to discuss lab results - most wnl, Likely familial hypercholesterolemia. She has agreed to start statin therapy:  Atorvastatin 20mg  PO qd   16mo trial, recheck lipids in 3 mos. Titrate up dose as needed.   Kathrin Ruddy, NP

## 2019-06-13 ENCOUNTER — Encounter: Payer: Self-pay | Admitting: Registered Nurse

## 2019-06-18 ENCOUNTER — Other Ambulatory Visit: Payer: Self-pay | Admitting: Registered Nurse

## 2019-06-18 DIAGNOSIS — R41840 Attention and concentration deficit: Secondary | ICD-10-CM

## 2019-06-18 NOTE — Progress Notes (Signed)
Resending referral for attention deficit.  Becky Coss, NP

## 2019-07-30 ENCOUNTER — Encounter: Payer: Self-pay | Admitting: Registered Nurse

## 2019-07-30 ENCOUNTER — Other Ambulatory Visit: Payer: Self-pay | Admitting: Registered Nurse

## 2019-07-30 DIAGNOSIS — Z1231 Encounter for screening mammogram for malignant neoplasm of breast: Secondary | ICD-10-CM

## 2019-08-06 ENCOUNTER — Ambulatory Visit
Admission: RE | Admit: 2019-08-06 | Discharge: 2019-08-06 | Disposition: A | Discharge status: Home / Self Care | Payer: Commercial Managed Care - PPO | Source: Ambulatory Visit | Attending: Registered Nurse | Admitting: Registered Nurse

## 2019-08-06 ENCOUNTER — Other Ambulatory Visit: Payer: Self-pay

## 2019-08-06 DIAGNOSIS — Z1231 Encounter for screening mammogram for malignant neoplasm of breast: Secondary | ICD-10-CM

## 2019-10-02 ENCOUNTER — Encounter: Payer: Self-pay | Admitting: Registered Nurse

## 2019-10-02 ENCOUNTER — Other Ambulatory Visit: Payer: Self-pay

## 2019-10-02 ENCOUNTER — Ambulatory Visit: Payer: Commercial Managed Care - PPO | Admitting: Registered Nurse

## 2019-10-02 VITALS — BP 126/84 | HR 84 | Temp 98.8°F | Resp 16 | Ht 62.4 in | Wt 191.0 lb

## 2019-10-02 DIAGNOSIS — Z20822 Contact with and (suspected) exposure to covid-19: Secondary | ICD-10-CM

## 2019-10-02 DIAGNOSIS — Z20828 Contact with and (suspected) exposure to other viral communicable diseases: Secondary | ICD-10-CM | POA: Diagnosis not present

## 2019-10-02 DIAGNOSIS — E7801 Familial hypercholesterolemia: Secondary | ICD-10-CM

## 2019-10-02 NOTE — Patient Instructions (Signed)
° ° ° °  If you have lab work done today you will be contacted with your lab results within the next 2 weeks.  If you have not heard from us then please contact us. The fastest [place] to get your results is to register for My Chart. ° ° °IF you received an x-ray today, you will receive an invoice from [facility]. Please contact [facility] at [phone] with questions or concerns regarding your invoice.  ° °IF you received labwork today, you will receive an invoice from [facility]. Please contact [facility] at [phone] with questions or concerns regarding your invoice.  ° °Our billing staff will not be able to assist you with questions regarding bills from these companies. ° °You will be contacted with the lab results as soon as they are available. The fastest [place] to get your results is to activate your My Chart account. Instructions are located on the last page of this paperwork. If you have not heard from us regarding the results in 2 weeks, please contact this office. °  ° ° ° °

## 2019-10-02 NOTE — Progress Notes (Signed)
Established Patient Office Visit  Subjective:  Patient ID: Becky Jensen, female    DOB: 01/28/1971  Age: 48 y.o. MRN: [id number]  CC:  Chief Complaint  Patient presents with  . Chronic Conditions    3 month follow-up     HPI Becky Jensen presents for 8mo follow up for HLD. Notes a strong family hx of HLD  She was started on atorvastatin 20mg  PO every evening and has been taking it regularly. Denies changes to urine, muscle aches, or URI symptoms. Denies any other noted side effects.   Also states she notes her ankle has been more sore lately. She had surgery on it around 1 year ago, has not done any PT besides routine post-op. She states it feels swollen and is affecting her gait. The affected ankle is on her R side, she is favoring her L side and fears overcompensation injuries. Requests referral to PT  In addition, she has upcoming travel to [place] to assist her older parents. She is hoping to have a COVID test done prior to leaving to give her peace of mind. She states that she has been socially distancing and following CDC guidelines for the previous two weeks and intends to do the same when she returns home.  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   Otherwise, feels well, no other complaints.   Past Medical History:  Diagnosis Date  . Depression   . Hyperlipidemia     Past Surgical History:  Procedure Laterality Date  . ANKLE FRACTURE SURGERY Right 08/2018  . TONSILLECTOMY      Family History  Problem Relation Age of Onset  . Hyperlipidemia Mother   . Hypertension Father   . Cancer Maternal Grandmother 80       stomach  . Heart disease Maternal Grandfather   . Cancer Paternal Grandmother        NH lymphoma  . Hypertension Paternal Grandfather   . Stroke Paternal Grandfather   . Breast cancer Maternal Aunt   . Breast cancer Cousin     Social History   Socioeconomic History  . Marital status:  Married    Spouse name: Not on file  . Number of children: 0  . Years of education: Not on file  . Highest education level: Not on file  Occupational History  . 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 Financial resource strain: Not hard at all  . Food insecurity    Worry: Never true    Inability: Never true  . Transportation needs    Medical: No    Non-medical: No  Tobacco Use  . Smoking status: Former Smoker    Quit date: 06/09/2009    Years since quitting: 10.3  . Smokeless tobacco: Never Used  . Tobacco comment: sporadic  Substance and Sexual Activity  . Alcohol use: Yes    Comment: 10 drinks per week  . Drug use: No  . Sexual activity: Yes    Partners: Male    Birth control/protection: None  Lifestyle  . Physical activity    Days per week: 5 days    Minutes per session: 30 min  . Stress: Not at all  Relationships  . Social connections    Talks on phone: More than three times a week    Gets together: Once a week    Attends religious service: Never    Active  member of club or organization: No    Attends meetings of clubs or organizations: Never    Relationship status: Married  . Intimate partner violence    Fear of current or ex partner: No    Emotionally abused: No    Physically abused: No    Forced sexual activity: No  Other Topics Concern  . Not on file  Social History Narrative   Pt is from [place]. Moved to [place] in 2004. Lives at home with husband.     Outpatient Medications Prior to Visit  Medication Sig Dispense Refill  . atorvastatin (LIPITOR) 20 MG tablet Take 1 tablet (20 mg total) by mouth daily. 90 tablet 3  . cyclobenzaprine (FLEXERIL) 5 MG tablet Take 1 tablet (5 mg total) by mouth 3 (three) times daily as needed for muscle spasms. 60 tablet 0  . FLUoxetine (PROZAC) 40 MG capsule TAKE 1 CAPSULE(40 MG) BY MOUTH DAILY 90 capsule 3  . ibuprofen (ADVIL,MOTRIN) 200 MG tablet Take 400 mg by mouth every 6 (six) hours as  needed for moderate pain.    Marland Kitchen lisdexamfetamine (VYVANSE) 30 MG capsule     . Multiple Vitamin (MULTIVITAMIN WITH MINERALS) TABS tablet Take 1 tablet by mouth daily.     No facility-administered medications prior to visit.     No Known Allergies  ROS Review of Systems  Constitutional: Negative.   HENT: Negative.   Eyes: Negative.   Respiratory: Negative.   Cardiovascular: Negative.   Gastrointestinal: Negative.   Endocrine: Negative.   Genitourinary: Negative.   Musculoskeletal: Positive for arthralgias (R ankle).  Skin: Negative.   Allergic/Immunologic: Negative.   Neurological: Negative.   Hematological: Negative.   Psychiatric/Behavioral: Negative.   All other systems reviewed and are negative.     Objective:    Physical Exam  Constitutional: She is oriented to person, place, and time. She appears well-developed and well-nourished. No distress.  Cardiovascular: Normal rate and regular rhythm.  Pulmonary/Chest: Effort normal. No respiratory distress.  Musculoskeletal:        General: Edema (edema and limited ROM in R ankle) present. No tenderness or deformity.  Neurological: She is alert and oriented to person, place, and time.  Skin: Skin is warm and dry. No rash noted. She is not diaphoretic. No erythema. No pallor.  Psychiatric: She has a normal mood and affect. Her behavior is normal. Judgment and thought content normal.  Nursing note and vitals reviewed.   BP 126/84   Pulse 84   Temp 98.8 F (37.1 C) (Oral)   Resp 16   Ht 5' 2.4" (1.585 m)   Wt 191 lb (86.6 kg)   LMP 09/27/2019 (Approximate)   SpO2 98%   BMI 34.49 kg/m  Wt Readings from Last 3 Encounters:  10/02/19 191 lb (86.6 kg)  06/10/19 202 lb (91.6 kg)  01/29/19 190 lb (86.2 kg)     There are no preventive care reminders to display for this patient.  There are no preventive care reminders to display for this patient.  Lab Results  Component Value Date   TSH 2.820 06/10/2019   Lab  Results  Component Value Date   WBC 7.9 06/10/2019   HGB 14.7 06/10/2019   HCT 44.3 06/10/2019   MCV 97 06/10/2019   PLT 359 06/10/2019   Lab Results  Component Value Date   NA 139 06/10/2019   K 4.4 06/10/2019   CO2 22 06/10/2019   GLUCOSE 88 06/10/2019   BUN 10 06/10/2019   CREATININE  0.68 06/10/2019   BILITOT <0.2 06/10/2019   ALKPHOS 86 06/10/2019   AST 23 06/10/2019   ALT 19 06/10/2019   PROT 6.7 06/10/2019   ALBUMIN 4.3 06/10/2019   CALCIUM 9.5 06/10/2019   Lab Results  Component Value Date   CHOL 347 (H) 06/10/2019   Lab Results  Component Value Date   HDL 69 06/10/2019   Lab Results  Component Value Date   LDLCALC 231 (H) 06/10/2019   Lab Results  Component Value Date   TRIG 236 (H) 06/10/2019   Lab Results  Component Value Date   CHOLHDL 5.0 (H) 06/10/2019   Lab Results  Component Value Date   HGBA1C 5.4 06/10/2019      Assessment & Plan:   Problem List Items Addressed This Visit    None    Visit Diagnoses    Encounter for screening laboratory testing for COVID-19 virus    -  Primary   Relevant Orders   Novel Coronavirus, NAA ([facility])   Familial hypercholesterolemia          No orders of the defined types were placed in this encounter.   Follow-up: No follow-ups on file.   PLAN  Ordered Lipid screening  - released previous order. Will follow up as warranted - depending on results, may increase atorvastatin or maintain same dose.   Will plan follow up based on lipid panel results  COVID test ordered  Patient encouraged to call clinic with any questions, comments, or concerns.   Maximiano Coss, NP

## 2019-10-03 LAB — LIPID PANEL
Chol/HDL Ratio: 2.9 ratio (ref 0.0–4.4)
Cholesterol, Total: 250 mg/dL — ABNORMAL HIGH (ref 100–199)
HDL: 85 mg/dL (ref >39)
LDL Chol Calc (NIH): 136 mg/dL — ABNORMAL HIGH (ref 0–99)
Triglycerides: 165 mg/dL — ABNORMAL HIGH (ref 0–149)
VLDL Cholesterol Cal: 29 mg/dL (ref 5–40)

## 2019-10-04 ENCOUNTER — Other Ambulatory Visit: Payer: Self-pay | Admitting: Registered Nurse

## 2019-10-04 ENCOUNTER — Encounter: Payer: Self-pay | Admitting: Registered Nurse

## 2019-10-04 DIAGNOSIS — E7801 Familial hypercholesterolemia: Secondary | ICD-10-CM

## 2019-10-04 LAB — NOVEL CORONAVIRUS, NAA: SARS-CoV-2, NAA: NOT DETECTED

## 2019-10-04 MED ORDER — ATORVASTATIN CALCIUM 20 MG PO TABS: 20.0000 mg | ORAL_TABLET | Freq: Every day | ORAL | 3 refills | Status: DC

## 2019-10-04 NOTE — Progress Notes (Signed)
Sent via MyChart:  Ms Peschke- Your lab results are back, and they're great. We have a negative COVID test result, and your lipids have taken a nosedive. Let's keep on the same plan we've been on, and we can recheck these numbers in about 1 year. I'll send over atorvastatin refills accordingly. Have a great trip - stay healthy! Please don't hesitate to let me know if there's anything else I can do for you.  Kathrin Ruddy, NP

## 2019-11-22 ENCOUNTER — Encounter: Payer: Self-pay | Admitting: Registered Nurse

## 2019-12-09 ENCOUNTER — Other Ambulatory Visit: Payer: Self-pay | Admitting: Registered Nurse

## 2019-12-09 DIAGNOSIS — S82841A Displaced bimalleolar fracture of right lower leg, initial encounter for closed fracture: Secondary | ICD-10-CM

## 2020-01-14 ENCOUNTER — Ambulatory Visit (INDEPENDENT_AMBULATORY_CARE_PROVIDER_SITE_OTHER): Payer: Commercial Managed Care - PPO | Admitting: Physical Therapy

## 2020-01-14 ENCOUNTER — Other Ambulatory Visit: Payer: Self-pay

## 2020-01-14 DIAGNOSIS — M25671 Stiffness of right ankle, not elsewhere classified: Secondary | ICD-10-CM

## 2020-01-14 DIAGNOSIS — M25571 Pain in right ankle and joints of right foot: Secondary | ICD-10-CM

## 2020-01-14 DIAGNOSIS — R2689 Other abnormalities of gait and mobility: Secondary | ICD-10-CM | POA: Diagnosis not present

## 2020-01-15 ENCOUNTER — Encounter: Payer: Self-pay | Admitting: Physical Therapy

## 2020-01-15 NOTE — Patient Instructions (Signed)
Access Code: [email or web address]  URL: [email or web address]  Date: 01/14/2020  Prepared by: Lyndee Hensen   Exercises Gastroc Stretch on Wall - 3 reps - 30 hold - 2x daily Standing Dorsiflexion Self-Mobilization on Step - 10 reps - 2 sets - 2x daily Seated Heel Raise - 10 reps - 2 sets - 2x daily Long Sitting Ankle Dorsiflexion AROM - 10 reps - 2 sets - 2x daily

## 2020-01-15 NOTE — Therapy (Signed)
[facility] [address] [place], [place], [address] Phone: [phone]   Fax:  [phone]  Physical Therapy Evaluation  Patient Details  Name: Becky Jensen MRN: [id number] Date of Birth: 01/17/1971 Referring Provider (PT): Maximiano Coss   Encounter Date: 01/14/2020  PT End of Session - 01/15/20 1026    Visit Number  1    Number of Visits  12    Date for PT Re-Evaluation  02/25/20    Authorization Type  [employer]    PT Start Time  1600    PT Stop Time  1640    PT Time Calculation (min)  40 min    Activity Tolerance  Patient tolerated treatment well    Behavior During Therapy  [facility] for tasks assessed/performed       Past Medical History:  Diagnosis Date  . Depression   . Hyperlipidemia     Past Surgical History:  Procedure Laterality Date  . ANKLE FRACTURE SURGERY Right 08/2018  . TONSILLECTOMY      There were no vitals filed for this visit.   Subjective Assessment - 01/14/20 1609    Subjective  Pt had R ankle surgery in Sept 2019 ORIF for bimalleolar fx. Did heal well, but did not have PT after. She has mild soreness, increased with activity, but feels she is not back to doing all regular activities, still hesitant for exercie, on stairs, and on uneven surfaces. Not doing HEP for ankle, wants to exercise in general more. Works as a Pharmacist, hospital, is active, up and down.    Limitations  Standing;Walking;House hold activities    Patient Stated Goals  Decreased pain, increased function of ankle.    Currently in Pain?  Yes    Pain Score  2     Pain Location  Ankle    Pain Orientation  Right    Pain Descriptors / Indicators  Aching    Pain Type  Chronic pain    Pain Onset  More than a month ago    Pain Frequency  Intermittent    Aggravating Factors   standing, walking, stairs, increased activity    Multiple Pain Sites  Yes         [facility] PT Assessment - 01/15/20 0001      Assessment   Medical Diagnosis  Bimalleolar ankle  fracture    Referring Provider (PT)  Maximiano Coss    Onset Date/Surgical Date  09/10/18   ORIF    Prior Therapy  no      Balance Screen   Has the patient fallen in the past 6 months  No      Prior Function   Level of Independence  Independent      Cognition   Overall Cognitive Status  Within Functional Limits for tasks assessed      Posture/Postural Control   Posture Comments  High arch bilaterally,       ROM / Strength   AROM / PROM / Strength  AROM;Strength      AROM   AROM Assessment Site  Ankle    Right/Left Ankle  Right;Left    Right Ankle Dorsiflexion  2    Right Ankle Plantar Flexion  --   wnl   Right Ankle Inversion  --   wnl   Right Ankle Eversion  --   wnl   Left Ankle Dorsiflexion  10      Strength   Overall Strength Comments  R ankle: 4/5;  hip: 4/5,  knee: 4+/5       Palpation   Palpation comment  hypomobile R STJ, lacking sufficient DF, tightness in gastrocs bilaterallly       Ambulation/Gait   Gait Comments  decreased DF ability, decreased ability for terminal stance, compensatory toe out, and medial rotation with push off on R.  Inability to desend stair with R , due to lack of DF.       Balance   Balance Assessed  --   Increased sway for SLS, 10 sec on R,                 Objective measurements completed on examination: See above findings.      [facility] Adult PT Treatment/Exercise - 01/15/20 0001      Exercises   Exercises  Ankle      Ankle Exercises: Stretches   Gastroc Stretch  2 reps;30 seconds    Gastroc Stretch Limitations  at wall    Other Stretch  DF glides on step x10;       Ankle Exercises: Seated   Heel Raises  15 reps    Heel Raises Limitations  with education on form    Other Seated Ankle Exercises  ankle PF/DF AROM x15;              PT Education - 01/15/20 1025    Education Details  PT POC, Exam findings, HEP    Person(s) Educated  Patient    Methods  Explanation;Demonstration;Tactile cues;Verbal  cues;Handout    Comprehension  Verbalized understanding;Returned demonstration;Verbal cues required;Tactile cues required;Need further instruction       PT Short Term Goals - 01/15/20 1047      PT SHORT TERM GOAL #1   Title  Pt to be independent with initial HEP    Time  2    Period  Weeks    Status  New    Target Date  01/29/20      PT SHORT TERM GOAL #2   Title  Pt to demo improved ankle DF by 2 degrees.    Time  2        PT Long Term Goals - 01/15/20 1051      PT LONG TERM GOAL #1   Title  Pt to be independent wtih final HEP    Time  6    Period  Weeks    Status  New    Target Date  02/25/20      PT LONG TERM GOAL #2   Title  Pt to report decreased pain in R ankle to 0-1/10 with weight bearing activity    Time  6    Period  Weeks    Status  New    Target Date  02/25/20      PT LONG TERM GOAL #3   Title  Pt to demo improved ankle DF to at least 5 degrees, to improve ability for walking and stair negotiation.    Time  6    Period  Weeks    Status  New    Target Date  02/25/20      PT LONG TERM GOAL #4   Title  Pt to demo improved gait mechanics, to be WNL, with improved ability for DF and push off, to improve endurance and ability for exercise    Time  6    Period  Weeks    Status  New    Target Date  02/25/20  Plan - 01/15/20 1057    Clinical Impression Statement  Pt presents wtih primary complaint of increased pain in R ankle. Pt had ORIF of malleolar fx in 9/19. Pt with much stiffness in R STJ, with lack fo sufficient DF. Lack of ROM causing gait deficits, as well as difficulty with functional activity and stairs. Pt with decreased ability and confidence with stability, uneven surfaces, and single leg activity. pt to benefit from skilled PT to improve deficits and pain.    Personal Factors and Comorbidities  Comorbidity 1    Comorbidities  R ORIF 09/10/18    Examination-Activity Limitations  Stairs;Stand;Locomotion Level     Examination-Participation Restrictions  Community Activity;School;Shop;Yard Work    Stability/Clinical Decision Making  Stable/Uncomplicated    Clinical Decision Making  Low    Rehab Potential  Good    PT Frequency  2x / week    PT Duration  6 weeks    PT Treatment/Interventions  ADLs/Self Care Home Management;Cryotherapy;Electrical Stimulation;Gait training;DME Instruction;Ultrasound;Moist Heat;Iontophoresis 4mg /ml Dexamethasone;Stair training;Functional mobility training;Therapeutic activities;Therapeutic exercise;Balance training;Manual techniques;Neuromuscular re-education;Orthotic Fit/Training;Patient/family education;Passive range of motion;Dry needling;Joint Manipulations;Spinal Manipulations;Vasopneumatic Device;Taping    PT Home Exercise Plan  [email or web address]    Consulted and Agree with Plan of Care  Patient       Patient will benefit from skilled therapeutic intervention in order to improve the following deficits and impairments:  Abnormal gait, Decreased range of motion, Difficulty walking, Decreased activity tolerance, Pain, Hypomobility, Decreased balance, Decreased mobility, Decreased strength  Visit Diagnosis: Pain in right ankle and joints of right foot  Stiffness of ankle joint, right  Other abnormalities of gait and mobility     Problem List Patient Active Problem List   Diagnosis Date Noted  . Bimalleolar ankle fracture, right, closed, initial encounter 09/05/2018  . Infertility, female 03/20/2015  . Attention deficit disorder of adult 12/26/1978    Lyndee Hensen, PT, DPT 12:13 PM  01/15/20    [facility] [place], [place], [address] Phone: [phone]   Fax:  [phone]  Name: Becky Jensen MRN: [id number] Date of Birth: 03/14/1971

## 2020-01-21 ENCOUNTER — Other Ambulatory Visit: Payer: Self-pay

## 2020-01-21 ENCOUNTER — Ambulatory Visit (INDEPENDENT_AMBULATORY_CARE_PROVIDER_SITE_OTHER): Payer: Commercial Managed Care - PPO | Admitting: Physical Therapy

## 2020-01-21 ENCOUNTER — Encounter: Payer: Self-pay | Admitting: Physical Therapy

## 2020-01-21 DIAGNOSIS — M25671 Stiffness of right ankle, not elsewhere classified: Secondary | ICD-10-CM

## 2020-01-21 DIAGNOSIS — M25571 Pain in right ankle and joints of right foot: Secondary | ICD-10-CM

## 2020-01-21 DIAGNOSIS — R2689 Other abnormalities of gait and mobility: Secondary | ICD-10-CM

## 2020-01-21 NOTE — Therapy (Signed)
[facility] [address] [place], [place], [address] Phone: [phone]   Fax:  [phone]  Physical Therapy Treatment  Patient Details  Name: Becky Jensen MRN: [id number] Date of Birth: [birth year] Referring Provider (PT): Maximiano Coss   Encounter Date: 01/21/2020  PT End of Session - 01/21/20 1527    Visit Number  2    Number of Visits  12    Date for PT Re-Evaluation  02/25/20    Authorization Type  [employer]HC    PT Start Time  1522    PT Stop Time  1603    PT Time Calculation (min)  41 min    Activity Tolerance  Patient tolerated treatment well    Behavior During Therapy  [facility] for tasks assessed/performed       Past Medical History:  Diagnosis Date  . Depression   . Hyperlipidemia     Past Surgical History:  Procedure Laterality Date  . ANKLE FRACTURE SURGERY Right 08/2018  . TONSILLECTOMY      There were no vitals filed for this visit.  Subjective Assessment - 01/21/20 1527    Subjective  Pt states mild soreness today. Has been able to do HEP.    Currently in Pain?  Yes    Pain Score  2     Pain Location  Ankle    Pain Orientation  Right    Pain Descriptors / Indicators  Aching    Pain Type  Chronic pain    Pain Onset  More than a month ago    Pain Frequency  Intermittent                       [facility] Adult PT Treatment/Exercise - 01/21/20 1528      Ambulation/Gait   Gait Comments  decreased DF ability, decreased ability for terminal stance, compensatory toe out, and medial rotation with push off on R.  Inability to desend stair with R , due to lack of DF.       Posture/Postural Control   Posture Comments  High arch bilaterally,       Exercises   Exercises  Ankle      Manual Therapy   Manual Therapy  Joint mobilization;Soft tissue mobilization;Taping    Joint Mobilization  STJ mobs to increase DF    Soft tissue mobilization  DTM and IASTM to R gastroc and achilles     Kinesiotex  Inhibit  Muscle      Kinesiotix   Inhibit Muscle   1 I and 1 Y strip for gastroc inhibition      Ankle Exercises: Stretches   Gastroc Stretch  2 reps;30 seconds    Gastroc Stretch Limitations  at wall and on step     Other Stretch  --    Other Stretch  DF glides kneeling at wall       Ankle Exercises: Aerobic   Stationary Bike  L1 x 6 min      Ankle Exercises: Seated   Heel Raises  15 reps    Heel Raises Limitations  with education on form    Other Seated Ankle Exercises  ankle PF/DF AROM x15;                PT Short Term Goals - 01/15/20 1047      PT SHORT TERM GOAL #1   Title  Pt to be independent with initial HEP    Time  2  Period  Weeks    Status  New    Target Date  01/29/20      PT SHORT TERM GOAL #2   Title  Pt to demo improved ankle DF by 2 degrees.    Time  2        PT Long Term Goals - 01/15/20 1051      PT LONG TERM GOAL #1   Title  Pt to be independent wtih final HEP    Time  6    Period  Weeks    Status  New    Target Date  02/25/20      PT LONG TERM GOAL #2   Title  Pt to report decreased pain in R ankle to 0-1/10 with weight bearing activity    Time  6    Period  Weeks    Status  New    Target Date  02/25/20      PT LONG TERM GOAL #3   Title  Pt to demo improved ankle DF to at least 5 degrees, to improve ability for walking and stair negotiation.    Time  6    Period  Weeks    Status  New    Target Date  02/25/20      PT LONG TERM GOAL #4   Title  Pt to demo improved gait mechanics, to be WNL, with improved ability for DF and push off, to improve endurance and ability for exercise    Time  6    Period  Weeks    Status  New    Target Date  02/25/20            Plan - 01/21/20 1621    Clinical Impression Statement  Reviewed mechanics for HEP. Manual, joint mobs, DTM and taping done to improve DF ROM. Ther ex for stretching also to improve ROM. Will progress as tolerated. Add standing weight shifts next visit.    Personal  Factors and Comorbidities  Comorbidity 1    Comorbidities  R ORIF 09/10/18    Examination-Activity Limitations  Stairs;Stand;Locomotion Level    Examination-Participation Restrictions  Community Activity;School;Shop;Yard Work    Stability/Clinical Decision Making  Stable/Uncomplicated    Rehab Potential  Good    PT Frequency  2x / week    PT Duration  6 weeks    PT Treatment/Interventions  ADLs/Self Care Home Management;Cryotherapy;Electrical Stimulation;Gait training;DME Instruction;Ultrasound;Moist Heat;Iontophoresis 4mg /ml Dexamethasone;Stair training;Functional mobility training;Therapeutic activities;Therapeutic exercise;Balance training;Manual techniques;Neuromuscular re-education;Orthotic Fit/Training;Patient/family education;Passive range of motion;Dry needling;Joint Manipulations;Spinal Manipulations;Vasopneumatic Device;Taping    PT Home Exercise Plan  [email or web address]    Consulted and Agree with Plan of Care  Patient       Patient will benefit from skilled therapeutic intervention in order to improve the following deficits and impairments:  Abnormal gait, Decreased range of motion, Difficulty walking, Decreased activity tolerance, Pain, Hypomobility, Decreased balance, Decreased mobility, Decreased strength  Visit Diagnosis: Stiffness of ankle joint, right  Pain in right ankle and joints of right foot  Other abnormalities of gait and mobility     Problem List Patient Active Problem List   Diagnosis Date Noted  . Bimalleolar ankle fracture, right, closed, initial encounter 09/05/2018  . Infertility, female 03/20/2015  . Attention deficit disorder of adult 12/26/1978   Lyndee Hensen, PT, DPT 4:22 PM  01/21/20    [facility] [place], [place], [address] Phone: [phone]   Fax:  [phone]  Name: Becky Jensen MRN:  [id number] Date of Birth: 1971-09-28

## 2020-01-23 ENCOUNTER — Other Ambulatory Visit: Payer: Self-pay

## 2020-01-23 ENCOUNTER — Ambulatory Visit (INDEPENDENT_AMBULATORY_CARE_PROVIDER_SITE_OTHER): Payer: Commercial Managed Care - PPO | Admitting: Physical Therapy

## 2020-01-23 DIAGNOSIS — M25671 Stiffness of right ankle, not elsewhere classified: Secondary | ICD-10-CM

## 2020-01-23 DIAGNOSIS — R2689 Other abnormalities of gait and mobility: Secondary | ICD-10-CM | POA: Diagnosis not present

## 2020-01-23 DIAGNOSIS — M25571 Pain in right ankle and joints of right foot: Secondary | ICD-10-CM | POA: Diagnosis not present

## 2020-01-26 ENCOUNTER — Encounter: Payer: Self-pay | Admitting: Physical Therapy

## 2020-01-26 NOTE — Therapy (Signed)
[facility] [address] [place], [place], [address] Phone: [phone]   Fax:  [phone]  Physical Therapy Treatment  Patient Details  Name: Becky Jensen MRN: [id number] Date of Birth: 09-Nov-1971 Referring Provider (PT): Maximiano Coss   Encounter Date: 01/23/2020  PT End of Session - 01/26/20 1418    Visit Number  3    Number of Visits  12    Date for PT Re-Evaluation  02/25/20    Authorization Type  UHC    PT Start Time  1602    PT Stop Time  1644    PT Time Calculation (min)  42 min    Activity Tolerance  Patient tolerated treatment well    Behavior During Therapy  [facility] for tasks assessed/performed       Past Medical History:  Diagnosis Date  . Depression   . Hyperlipidemia     Past Surgical History:  Procedure Laterality Date  . ANKLE FRACTURE SURGERY Right 08/2018  . TONSILLECTOMY      There were no vitals filed for this visit.  Subjective Assessment - 01/26/20 1404    Subjective  Pt has been doing HEP    Currently in Pain?  Yes    Pain Location  Ankle    Pain Orientation  Right    Pain Descriptors / Indicators  Aching    Pain Type  Chronic pain    Pain Onset  More than a month ago    Pain Frequency  Intermittent                       [facility] Adult PT Treatment/Exercise - 01/26/20 0001      Ambulation/Gait   Gait Comments  decreased DF ability, decreased ability for terminal stance, compensatory toe out, and medial rotation with push off on R.  Inability to desend stair with R , due to lack of DF.       Posture/Postural Control   Posture Comments  High arch bilaterally,       Exercises   Exercises  Ankle      Manual Therapy   Manual Therapy  Joint mobilization;Soft tissue mobilization;Taping    Joint Mobilization  STJ mobs to increase DF    Soft tissue mobilization  DTM and IASTM to R gastroc and achilles     Kinesiotex  Inhibit Muscle      Kinesiotix   Inhibit Muscle   1 I and 1 Y  strip for gastroc inhibition      Ankle Exercises: Stretches   Other Stretch  DF glides kneeling at wall       Ankle Exercises: Aerobic   Stationary Bike  L2 x 6 min      Ankle Exercises: Standing   SLS  20 sec x3 bil;     Heel Raises  15 reps    Heel Raises Limitations  with bal squeeze    Other Standing Ankle Exercises  Staggered stance weight shifts for pre-gait , x25 bil;       Ankle Exercises: Supine   Other Supine Ankle Exercises  Bridging x20               PT Short Term Goals - 01/15/20 1047      PT SHORT TERM GOAL #1   Title  Pt to be independent with initial HEP    Time  2    Period  Weeks    Status  New  Target Date  01/29/20      PT SHORT TERM GOAL #2   Title  Pt to demo improved ankle DF by 2 degrees.    Time  2        PT Long Term Goals - 01/15/20 1051      PT LONG TERM GOAL #1   Title  Pt to be independent wtih final HEP    Time  6    Period  Weeks    Status  New    Target Date  02/25/20      PT LONG TERM GOAL #2   Title  Pt to report decreased pain in R ankle to 0-1/10 with weight bearing activity    Time  6    Period  Weeks    Status  New    Target Date  02/25/20      PT LONG TERM GOAL #3   Title  Pt to demo improved ankle DF to at least 5 degrees, to improve ability for walking and stair negotiation.    Time  6    Period  Weeks    Status  New    Target Date  02/25/20      PT LONG TERM GOAL #4   Title  Pt to demo improved gait mechanics, to be WNL, with improved ability for DF and push off, to improve endurance and ability for exercise    Time  6    Period  Weeks    Status  New    Target Date  02/25/20            Plan - 01/26/20 1419    Clinical Impression Statement  Ther ex progressed today , for standing weight shifts, pre-gait, and stability. manual continued for increaseing DF. Pt with minimal pain during activities, stiffness still limiting DF and function.    Personal Factors and Comorbidities  Comorbidity 1     Comorbidities  R ORIF 09/10/18    Examination-Activity Limitations  Stairs;Stand;Locomotion Level    Examination-Participation Restrictions  Community Activity;School;Shop;Yard Work    Stability/Clinical Decision Making  Stable/Uncomplicated    Rehab Potential  Good    PT Frequency  2x / week    PT Duration  6 weeks    PT Treatment/Interventions  ADLs/Self Care Home Management;Cryotherapy;Electrical Stimulation;Gait training;DME Instruction;Ultrasound;Moist Heat;Iontophoresis 4mg /ml Dexamethasone;Stair training;Functional mobility training;Therapeutic activities;Therapeutic exercise;Balance training;Manual techniques;Neuromuscular re-education;Orthotic Fit/Training;Patient/family education;Passive range of motion;Dry needling;Joint Manipulations;Spinal Manipulations;Vasopneumatic Device;Taping    PT Home Exercise Plan  [id number]    Consulted and Agree with Plan of Care  Patient       Patient will benefit from skilled therapeutic intervention in order to improve the following deficits and impairments:  Abnormal gait, Decreased range of motion, Difficulty walking, Decreased activity tolerance, Pain, Hypomobility, Decreased balance, Decreased mobility, Decreased strength  Visit Diagnosis: Pain in right ankle and joints of right foot  Stiffness of ankle joint, right  Other abnormalities of gait and mobility     Problem List Patient Active Problem List   Diagnosis Date Noted  . Bimalleolar ankle fracture, right, closed, initial encounter 09/05/2018  . Infertility, female 03/20/2015  . Attention deficit disorder of adult 12/26/1978    Lyndee Hensen, PT, DPT 2:22 PM  01/26/20    [facility] [place], [place], [address] Phone: [phone]   Fax:  [phone]  Name: Becky Jensen MRN: [id number] Date of Birth: 07/25/1971

## 2020-01-28 ENCOUNTER — Other Ambulatory Visit: Payer: Self-pay

## 2020-01-28 ENCOUNTER — Ambulatory Visit (INDEPENDENT_AMBULATORY_CARE_PROVIDER_SITE_OTHER): Payer: Commercial Managed Care - PPO | Admitting: Physical Therapy

## 2020-01-28 DIAGNOSIS — M25671 Stiffness of right ankle, not elsewhere classified: Secondary | ICD-10-CM | POA: Diagnosis not present

## 2020-01-28 DIAGNOSIS — R2689 Other abnormalities of gait and mobility: Secondary | ICD-10-CM | POA: Diagnosis not present

## 2020-01-28 DIAGNOSIS — M25571 Pain in right ankle and joints of right foot: Secondary | ICD-10-CM

## 2020-01-30 ENCOUNTER — Encounter: Payer: Self-pay | Admitting: Physical Therapy

## 2020-01-30 ENCOUNTER — Other Ambulatory Visit: Payer: Self-pay

## 2020-01-30 ENCOUNTER — Ambulatory Visit (INDEPENDENT_AMBULATORY_CARE_PROVIDER_SITE_OTHER): Payer: Commercial Managed Care - PPO | Admitting: Physical Therapy

## 2020-01-30 DIAGNOSIS — M25671 Stiffness of right ankle, not elsewhere classified: Secondary | ICD-10-CM

## 2020-01-30 DIAGNOSIS — R2689 Other abnormalities of gait and mobility: Secondary | ICD-10-CM

## 2020-01-30 DIAGNOSIS — M25571 Pain in right ankle and joints of right foot: Secondary | ICD-10-CM

## 2020-01-30 NOTE — Therapy (Signed)
[facility] [address] [place], [place], [address] Phone: [phone]   Fax:  [phone]  Physical Therapy Treatment  Patient Details  Name: Becky Jensen MRN: [id number] Date of Birth: 1971-06-16 Referring Provider (PT): Maximiano Coss   Encounter Date: 01/28/2020  PT End of Session - 01/30/20 0817    Visit Number  4    Number of Visits  12    Date for PT Re-Evaluation  02/25/20    Authorization Type  UHC    PT Start Time  1600    PT Stop Time  [id number]    PT Time Calculation (min)  43 min    Activity Tolerance  Patient tolerated treatment well    Behavior During Therapy  [facility] for tasks assessed/performed       Past Medical History:  Diagnosis Date  . Depression   . Hyperlipidemia     Past Surgical History:  Procedure Laterality Date  . ANKLE FRACTURE SURGERY Right 08/2018  . TONSILLECTOMY      There were no vitals filed for this visit.  Subjective Assessment - 01/30/20 0816    Subjective  Pt states improved confidence with weight bearing activities on R ankle.    Currently in Pain?  Yes    Pain Location  Ankle    Pain Orientation  Right    Pain Descriptors / Indicators  Aching    Pain Type  Chronic pain    Pain Onset  More than a month ago    Pain Frequency  Intermittent         [facility] PT Assessment - 01/30/20 0001      AROM   Right Ankle Dorsiflexion  5                   [facility] Adult PT Treatment/Exercise - 01/30/20 0001      Ambulation/Gait   Gait Comments  decreased DF ability, decreased ability for terminal stance, compensatory toe out, and medial rotation with push off on R.  Inability to desend stair with R , due to lack of DF.       Posture/Postural Control   Posture Comments  High arch bilaterally,       Exercises   Exercises  Ankle      Manual Therapy   Manual Therapy  Joint mobilization;Soft tissue mobilization;Taping    Joint Mobilization  STJ mobs to increase DF      Ankle  Exercises: Stretches   Soleus Stretch  3 reps;30 seconds    Gastroc Stretch  3 reps;30 seconds    Gastroc Stretch Limitations  at wall      Ankle Exercises: Aerobic   Stationary Bike  L2 x 6 min      Ankle Exercises: Standing   SLS  20 sec x3 bil;     Heel Raises  20 reps    Heel Raises Limitations  with bal squeeze    Other Standing Ankle Exercises  Staggered stance weight shifts for pre-gait , x25 bil;   Walk/March 10 ft x4;     Other Standing Ankle Exercises  Step ups 6 in x20 bil;       Ankle Exercises: Seated   Other Seated Ankle Exercises  Ankle 4 Panos RTB x20 each'      Ankle Exercises: Supine   Other Supine Ankle Exercises  Bridging x20               PT Short Term Goals - 01/15/20  [place] #1   Title  Pt to be independent with initial HEP    Time  2    Period  Weeks    Status  New    Target Date  01/29/20      PT SHORT TERM GOAL #2   Title  Pt to demo improved ankle DF by 2 degrees.    Time  2        PT Long Term Goals - 01/15/20 1051      PT LONG TERM GOAL #1   Title  Pt to be independent wtih final HEP    Time  6    Period  Weeks    Status  New    Target Date  02/25/20      PT LONG TERM GOAL #2   Title  Pt to report decreased pain in R ankle to 0-1/10 with weight bearing activity    Time  6    Period  Weeks    Status  New    Target Date  02/25/20      PT LONG TERM GOAL #3   Title  Pt to demo improved ankle DF to at least 5 degrees, to improve ability for walking and stair negotiation.    Time  6    Period  Weeks    Status  New    Target Date  02/25/20      PT LONG TERM GOAL #4   Title  Pt to demo improved gait mechanics, to be WNL, with improved ability for DF and push off, to improve endurance and ability for exercise    Time  6    Period  Weeks    Status  New    Target Date  02/25/20            Plan - 01/30/20 0818    Clinical Impression Statement  Pt with improving DF ROM, up to 5 deg today. Improving  ability for weight shifts and confidence with full weight acceptance on R. Improving ability for stairs, still unable to desend with correct mechanics due to lack of ROM.    Personal Factors and Comorbidities  Comorbidity 1    Comorbidities  R ORIF 09/10/18    Examination-Activity Limitations  Stairs;Stand;Locomotion Level    Examination-Participation Restrictions  Community Activity;School;Shop;Yard Work    Stability/Clinical Decision Making  Stable/Uncomplicated    Rehab Potential  Good    PT Frequency  2x / week    PT Duration  6 weeks    PT Treatment/Interventions  ADLs/Self Care Home Management;Cryotherapy;Electrical Stimulation;Gait training;DME Instruction;Ultrasound;Moist Heat;Iontophoresis 4mg /ml Dexamethasone;Stair training;Functional mobility training;Therapeutic activities;Therapeutic exercise;Balance training;Manual techniques;Neuromuscular re-education;Orthotic Fit/Training;Patient/family education;Passive range of motion;Dry needling;Joint Manipulations;Spinal Manipulations;Vasopneumatic Device;Taping    PT Home Exercise Plan  [id number]    Consulted and Agree with Plan of Care  Patient       Patient will benefit from skilled therapeutic intervention in order to improve the following deficits and impairments:  Abnormal gait, Decreased range of motion, Difficulty walking, Decreased activity tolerance, Pain, Hypomobility, Decreased balance, Decreased mobility, Decreased strength  Visit Diagnosis: Pain in right ankle and joints of right foot  Stiffness of ankle joint, right  Other abnormalities of gait and mobility     Problem List Patient Active Problem List   Diagnosis Date Noted  . Bimalleolar ankle fracture, right, closed, initial encounter 09/05/2018  . Infertility, female 03/20/2015  . Attention deficit disorder of adult 12/26/1978  Lyndee Hensen, PT, DPT 8:19 AM  01/30/20    [facility] [place],  [place], [address] Phone: [phone]   Fax:  [phone]  Name: Becky Jensen MRN: [id number] Date of Birth: 12/24/1971

## 2020-01-30 NOTE — Therapy (Signed)
[facility] [address] [place], [place], [address] Phone: [phone]   Fax:  [phone]  Physical Therapy Treatment  Patient Details  Name: Becky Jensen MRN: [id number] Date of Birth: 1971-07-31 Referring Provider (PT): Maximiano Coss   Encounter Date: 01/30/2020  PT End of Session - 01/30/20 2051    Visit Number  5    Number of Visits  12    Date for PT Re-Evaluation  02/25/20    Authorization Type  UHC    PT Start Time  [id number]    PT Stop Time  1600    PT Time Calculation (min)  43 min    Activity Tolerance  Patient tolerated treatment well    Behavior During Therapy  [facility] for tasks assessed/performed       Past Medical History:  Diagnosis Date  . Depression   . Hyperlipidemia     Past Surgical History:  Procedure Laterality Date  . ANKLE FRACTURE SURGERY Right 08/2018  . TONSILLECTOMY      There were no vitals filed for this visit.  Subjective Assessment - 01/30/20 2050    Subjective  Pt states improving soreness.    Currently in Pain?  No/denies    Pain Score  0-No pain                       [facility] Adult PT Treatment/Exercise - 01/30/20 1523      Ambulation/Gait   Gait Comments  decreased DF ability, decreased ability for terminal stance, compensatory toe out, and medial rotation with push off on R.  Inability to desend stair with R , due to lack of DF.       Posture/Postural Control   Posture Comments  High arch bilaterally,       Exercises   Exercises  Ankle      Manual Therapy   Manual Therapy  Joint mobilization;Soft tissue mobilization;Taping    Joint Mobilization  STJ mobs to increase DF    Soft tissue mobilization  DTM and IASTM to R gastroc and achilles       Ankle Exercises: Stretches   Soleus Stretch  --    Gastroc Stretch  --    Gastroc Stretch Limitations  --      Ankle Exercises: Aerobic   Stationary Bike  L2 x 7 min      Ankle Exercises: Standing   SLS  20 sec x1 bil; x   SLS with head turns x10 bil;      Heel Raises  20 reps    Heel Raises Limitations  with bal squeeze    Other Standing Ankle Exercises   Walk/March 10 ft x4;  a/p and l/R weight shifts on AirEx x20;      Other Standing Ankle Exercises  Step ups 6 in x20 bil;       Ankle Exercises: Seated   Other Seated Ankle Exercises  Ankle 4 Libman RTB x20 each'      Ankle Exercises: Supine   Other Supine Ankle Exercises  --               PT Short Term Goals - 01/15/20 1047      PT SHORT TERM GOAL #1   Title  Pt to be independent with initial HEP    Time  2    Period  Weeks    Status  New    Target Date  01/29/20  PT SHORT TERM GOAL #2   Title  Pt to demo improved ankle DF by 2 degrees.    Time  2        PT Long Term Goals - 01/15/20 1051      PT LONG TERM GOAL #1   Title  Pt to be independent wtih final HEP    Time  6    Period  Weeks    Status  New    Target Date  02/25/20      PT LONG TERM GOAL #2   Title  Pt to report decreased pain in R ankle to 0-1/10 with weight bearing activity    Time  6    Period  Weeks    Status  New    Target Date  02/25/20      PT LONG TERM GOAL #3   Title  Pt to demo improved ankle DF to at least 5 degrees, to improve ability for walking and stair negotiation.    Time  6    Period  Weeks    Status  New    Target Date  02/25/20      PT LONG TERM GOAL #4   Title  Pt to demo improved gait mechanics, to be WNL, with improved ability for DF and push off, to improve endurance and ability for exercise    Time  6    Period  Weeks    Status  New    Target Date  02/25/20            Plan - 01/30/20 2052    Clinical Impression Statement  Pt with decreasing pain with activity, standing, and walking. Challenged with stability today, increased forefoot mobility and sway, due to rearfoot stiffness. Plan to progress strengh and stabilization and contine focus on ROM.    Personal Factors and Comorbidities  Comorbidity 1    Comorbidities  R  ORIF 09/10/18    Examination-Activity Limitations  Stairs;Stand;Locomotion Level    Examination-Participation Restrictions  Community Activity;School;Shop;Yard Work    Stability/Clinical Decision Making  Stable/Uncomplicated    Rehab Potential  Good    PT Frequency  2x / week    PT Duration  6 weeks    PT Treatment/Interventions  ADLs/Self Care Home Management;Cryotherapy;Electrical Stimulation;Gait training;DME Instruction;Ultrasound;Moist Heat;Iontophoresis 4mg /ml Dexamethasone;Stair training;Functional mobility training;Therapeutic activities;Therapeutic exercise;Balance training;Manual techniques;Neuromuscular re-education;Orthotic Fit/Training;Patient/family education;Passive range of motion;Dry needling;Joint Manipulations;Spinal Manipulations;Vasopneumatic Device;Taping    PT Home Exercise Plan  [id number]    Consulted and Agree with Plan of Care  Patient       Patient will benefit from skilled therapeutic intervention in order to improve the following deficits and impairments:  Abnormal gait, Decreased range of motion, Difficulty walking, Decreased activity tolerance, Pain, Hypomobility, Decreased balance, Decreased mobility, Decreased strength  Visit Diagnosis: Pain in right ankle and joints of right foot  Stiffness of ankle joint, right  Other abnormalities of gait and mobility     Problem List Patient Active Problem List   Diagnosis Date Noted  . Bimalleolar ankle fracture, right, closed, initial encounter 09/05/2018  . Infertility, female 03/20/2015  . Attention deficit disorder of adult 12/26/1978    Becky Jensen, PT, DPT 8:53 PM  01/30/20    [facility] [place], [place], [address] Phone: [phone]   Fax:  [phone]  Name: Becky Jensen MRN: [id number] Date of Birth: November 30, 1971

## 2020-02-04 ENCOUNTER — Encounter: Payer: Self-pay | Admitting: Physical Therapy

## 2020-02-04 ENCOUNTER — Ambulatory Visit (INDEPENDENT_AMBULATORY_CARE_PROVIDER_SITE_OTHER): Payer: Commercial Managed Care - PPO | Admitting: Physical Therapy

## 2020-02-04 ENCOUNTER — Other Ambulatory Visit: Payer: Self-pay

## 2020-02-04 DIAGNOSIS — M25571 Pain in right ankle and joints of right foot: Secondary | ICD-10-CM

## 2020-02-04 DIAGNOSIS — M25671 Stiffness of right ankle, not elsewhere classified: Secondary | ICD-10-CM | POA: Diagnosis not present

## 2020-02-04 DIAGNOSIS — R2689 Other abnormalities of gait and mobility: Secondary | ICD-10-CM | POA: Diagnosis not present

## 2020-02-04 NOTE — Therapy (Signed)
[facility] [address] [place], [place], [address] Phone: [phone]   Fax:  [phone]  Physical Therapy Treatment  Patient Details  Name: TAJIA REICHER MRN: [id number] Date of Birth: November 04, 1971 Referring Provider (PT): Maximiano Coss   Encounter Date: 02/04/2020  PT End of Session - 02/04/20 1603    Visit Number  6    Number of Visits  12    Date for PT Re-Evaluation  02/25/20    Authorization Type  UHC    PT Start Time  1600    PT Stop Time  1640    PT Time Calculation (min)  40 min    Activity Tolerance  Patient tolerated treatment well    Behavior During Therapy  [facility] for tasks assessed/performed       Past Medical History:  Diagnosis Date  . Depression   . Hyperlipidemia     Past Surgical History:  Procedure Laterality Date  . ANKLE FRACTURE SURGERY Right 08/2018  . TONSILLECTOMY      There were no vitals filed for this visit.  Subjective Assessment - 02/04/20 1602    Subjective  Pt states minimal soreness today, did have more soreness after last visit, and at times with increased activity    Currently in Pain?  No/denies    Pain Score  0-No pain                       [facility] Adult PT Treatment/Exercise - 02/04/20 1604      Ambulation/Gait   Gait Comments  decreased DF ability, decreased ability for terminal stance, compensatory toe out, and medial rotation with push off on R.  Inability to desend stair with R , due to lack of DF.       Posture/Postural Control   Posture Comments  High arch bilaterally,       Exercises   Exercises  Ankle      Manual Therapy   Manual Therapy  Joint mobilization;Soft tissue mobilization;Taping;Passive ROM    Joint Mobilization  STJ mobs to increase DF    Soft tissue mobilization  --    Passive ROM  gastroc stretching to inc DF.       Ankle Exercises: Stretches   Soleus Stretch  3 reps;30 seconds    Soleus Stretch Limitations  at wall    Gastroc Stretch  3  reps;30 seconds    Gastroc Stretch Limitations  at wall    Other Stretch  DF glides on 2nd step x20; Seated DF glide x10;       Ankle Exercises: Aerobic   Stationary Bike  L2 x 6 min      Ankle Exercises: Standing   SLS  SLS with head turns x10 bil;      Heel Raises  20 reps    Heel Raises Limitations  with bal squeeze    Other Standing Ankle Exercises   Walk/March 10 ft x4; AirEx: squats x20; March x20;     Other Standing Ankle Exercises  Stairs 6 in , 5 stairs x3 1 hand rail       Ankle Exercises: Seated   Other Seated Ankle Exercises  Ankle 4 [place] RTB x20 each'      Ankle Exercises: Supine   Other Supine Ankle Exercises  Bridging x20               PT Short Term Goals - 02/04/20 1603      PT SHORT  TERM GOAL #1   Title  Pt to be independent with initial HEP    Time  2    Period  Weeks    Status  Achieved    Target Date  01/29/20      PT SHORT TERM GOAL #2   Title  Pt to demo improved ankle DF by 2 degrees.    Time  2    Status  Achieved        PT Long Term Goals - 01/15/20 1051      PT LONG TERM GOAL #1   Title  Pt to be independent wtih final HEP    Time  6    Period  Weeks    Status  New    Target Date  02/25/20      PT LONG TERM GOAL #2   Title  Pt to report decreased pain in R ankle to 0-1/10 with weight bearing activity    Time  6    Period  Weeks    Status  New    Target Date  02/25/20      PT LONG TERM GOAL #3   Title  Pt to demo improved ankle DF to at least 5 degrees, to improve ability for walking and stair negotiation.    Time  6    Period  Weeks    Status  New    Target Date  02/25/20      PT LONG TERM GOAL #4   Title  Pt to demo improved gait mechanics, to be WNL, with improved ability for DF and push off, to improve endurance and ability for exercise    Time  6    Period  Weeks    Status  New    Target Date  02/25/20            Plan - 02/04/20 1646    Clinical Impression Statement  Pt progressing well. Stiffness still  limiting activity, but is improving, and ankle getting stronger. Plan to progress as able.    Personal Factors and Comorbidities  Comorbidity 1    Comorbidities  R ORIF 09/10/18    Examination-Activity Limitations  Stairs;Stand;Locomotion Level    Examination-Participation Restrictions  Community Activity;School;Shop;Yard Work    Stability/Clinical Decision Making  Stable/Uncomplicated    Rehab Potential  Good    PT Frequency  2x / week    PT Duration  6 weeks    PT Treatment/Interventions  ADLs/Self Care Home Management;Cryotherapy;Electrical Stimulation;Gait training;DME Instruction;Ultrasound;Moist Heat;Iontophoresis 4mg /ml Dexamethasone;Stair training;Functional mobility training;Therapeutic activities;Therapeutic exercise;Balance training;Manual techniques;Neuromuscular re-education;Orthotic Fit/Training;Patient/family education;Passive range of motion;Dry needling;Joint Manipulations;Spinal Manipulations;Vasopneumatic Device;Taping    PT Home Exercise Plan  [id number]    Consulted and Agree with Plan of Care  Patient       Patient will benefit from skilled therapeutic intervention in order to improve the following deficits and impairments:  Abnormal gait, Decreased range of motion, Difficulty walking, Decreased activity tolerance, Pain, Hypomobility, Decreased balance, Decreased mobility, Decreased strength  Visit Diagnosis: Pain in right ankle and joints of right foot  Stiffness of ankle joint, right  Other abnormalities of gait and mobility     Problem List Patient Active Problem List   Diagnosis Date Noted  . Bimalleolar ankle fracture, right, closed, initial encounter 09/05/2018  . Infertility, female 03/20/2015  . Attention deficit disorder of adult 12/26/1978    Lyndee Hensen, PT, DPT 4:47 PM  02/04/20    [facility] [place]  [place], [place], [address] Phone: [phone]   Fax:  [phone]  Name: NKENGE MATTHIAS MRN: [id number] Date of Birth: 11-08-1971

## 2020-02-06 ENCOUNTER — Ambulatory Visit (INDEPENDENT_AMBULATORY_CARE_PROVIDER_SITE_OTHER): Payer: Commercial Managed Care - PPO | Admitting: Physical Therapy

## 2020-02-06 ENCOUNTER — Encounter: Payer: Self-pay | Admitting: Physical Therapy

## 2020-02-06 ENCOUNTER — Other Ambulatory Visit: Payer: Self-pay

## 2020-02-06 DIAGNOSIS — R2689 Other abnormalities of gait and mobility: Secondary | ICD-10-CM | POA: Diagnosis not present

## 2020-02-06 DIAGNOSIS — M25671 Stiffness of right ankle, not elsewhere classified: Secondary | ICD-10-CM | POA: Diagnosis not present

## 2020-02-06 DIAGNOSIS — M25571 Pain in right ankle and joints of right foot: Secondary | ICD-10-CM | POA: Diagnosis not present

## 2020-02-06 NOTE — Therapy (Addendum)
[facility] [address] [place], [place], [address] Phone: [phone]   Fax:  [phone]  Physical Therapy Treatment  Patient Details  Name: Becky Jensen MRN: [id number] Date of Birth: 1971-07-06 Referring Provider (PT): Maximiano Coss   Encounter Date: 02/06/2020  PT End of Session - 02/06/20 1648    Visit Number  7    Number of Visits  12    Date for PT Re-Evaluation  02/25/20    Authorization Type  [employer]    PT Start Time  5597    PT Stop Time  1646    PT Time Calculation (min)  41 min    Activity Tolerance  Patient tolerated treatment well    Behavior During Therapy  [facility] for tasks assessed/performed       Past Medical History:  Diagnosis Date  . Depression   . Hyperlipidemia     Past Surgical History:  Procedure Laterality Date  . ANKLE FRACTURE SURGERY Right 08/2018  . TONSILLECTOMY      There were no vitals filed for this visit.  Subjective Assessment - 02/06/20 1648    Subjective  Pt states no pain, doing better, feeling better / more confident with activity    Currently in Pain?  No/denies    Pain Score  0-No pain                       [facility] Adult PT Treatment/Exercise - 02/06/20 1612      Ambulation/Gait   Gait Comments  decreased DF ability, decreased ability for terminal stance, compensatory toe out, and medial rotation with push off on R.  Inability to desend stair with R , due to lack of DF.       Posture/Postural Control   Posture Comments  High arch bilaterally,       Exercises   Exercises  Ankle      Manual Therapy   Manual Therapy  Joint mobilization;Soft tissue mobilization;Taping;Passive ROM    Joint Mobilization  STJ mobs to increase DF    Passive ROM  --      Ankle Exercises: Stretches   Soleus Stretch  3 reps;30 seconds    Soleus Stretch Limitations  at wall    Gastroc Stretch  3 reps;30 seconds    Gastroc Stretch Limitations  at wall    Other Stretch  DF glides on 2nd  step x20; Seated DF glide x10;       Ankle Exercises: Aerobic   Stationary Bike  L2 x 6 min      Ankle Exercises: Standing   SLS  SLS with head turns x10 bil;      Heel Raises  20 reps    Heel Raises Limitations  with bal squeeze    Other Standing Ankle Exercises   Walk/March 10 ft x4; Side stepping 25 ft x4; Bwd walking 25 ft x1;      Other Standing Ankle Exercises  Step ups on AirEx x20 R;  Lunges x20 alt;       Ankle Exercises: Seated   Other Seated Ankle Exercises  Ankle 4 Tsang RTB x20 each'      Ankle Exercises: Supine   Other Supine Ankle Exercises  --               PT Short Term Goals - 02/04/20 1603      PT SHORT TERM GOAL #1   Title  Pt to be independent with initial  HEP    Time  2    Period  Weeks    Status  Achieved    Target Date  01/29/20      PT SHORT TERM GOAL #2   Title  Pt to demo improved ankle DF by 2 degrees.    Time  2    Status  Achieved        PT Long Term Goals - 01/15/20 1051      PT LONG TERM GOAL #1   Title  Pt to be independent wtih final HEP    Time  6    Period  Weeks    Status  New    Target Date  02/25/20      PT LONG TERM GOAL #2   Title  Pt to report decreased pain in R ankle to 0-1/10 with weight bearing activity    Time  6    Period  Weeks    Status  New    Target Date  02/25/20      PT LONG TERM GOAL #3   Title  Pt to demo improved ankle DF to at least 5 degrees, to improve ability for walking and stair negotiation.    Time  6    Period  Weeks    Status  New    Target Date  02/25/20      PT LONG TERM GOAL #4   Title  Pt to demo improved gait mechanics, to be WNL, with improved ability for DF and push off, to improve endurance and ability for exercise    Time  6    Period  Weeks    Status  New    Target Date  02/25/20            Plan - 02/06/20 1650    Clinical Impression Statement  Pt progressing well. Stiffness is main deficit, strengthening and stabilization improving. Plan to reduce to 1xwk ,  will continue to work on ROM and progress stabilization as tolerated.    Personal Factors and Comorbidities  Comorbidity 1    Comorbidities  R ORIF 09/10/18    Examination-Activity Limitations  Stairs;Stand;Locomotion Level    Examination-Participation Restrictions  Community Activity;School;Shop;Yard Work    Stability/Clinical Decision Making  Stable/Uncomplicated    Rehab Potential  Good    PT Frequency  2x / week    PT Duration  6 weeks    PT Treatment/Interventions  ADLs/Self Care Home Management;Cryotherapy;Electrical Stimulation;Gait training;DME Instruction;Ultrasound;Moist Heat;Iontophoresis '4mg'$ /ml Dexamethasone;Stair training;Functional mobility training;Therapeutic activities;Therapeutic exercise;Balance training;Manual techniques;Neuromuscular re-education;Orthotic Fit/Training;Patient/family education;Passive range of motion;Dry needling;Joint Manipulations;Spinal Manipulations;Vasopneumatic Device;Taping    PT Home Exercise Plan  [id number]    Consulted and Agree with Plan of Care  Patient       Patient will benefit from skilled therapeutic intervention in order to improve the following deficits and impairments:  Abnormal gait, Decreased range of motion, Difficulty walking, Decreased activity tolerance, Pain, Hypomobility, Decreased balance, Decreased mobility, Decreased strength  Visit Diagnosis: Pain in right ankle and joints of right foot  Stiffness of ankle joint, right  Other abnormalities of gait and mobility     Problem List Patient Active Problem List   Diagnosis Date Noted  . Bimalleolar ankle fracture, right, closed, initial encounter 09/05/2018  . Infertility, female 03/20/2015  . Attention deficit disorder of adult 12/26/1978    Lyndee Hensen, PT, DPT 4:51 PM  02/06/20    [facility] [place], [place], [address] Phone:  [phone]   Fax:  [phone]  Name: Becky Jensen MRN: [id number]     Date of Birth: [birth year]    PHYSICAL THERAPY DISCHARGE SUMMARY 7 Plan: Patient agrees to discharge.  Patient goals were partially met. Patient is being discharged due to not returning since the last visit.  ?????      Lyndee Hensen, PT, DPT 3:00 PM  01/06/21

## 2020-02-11 ENCOUNTER — Encounter: Payer: Commercial Managed Care - PPO | Admitting: Physical Therapy

## 2020-02-13 ENCOUNTER — Encounter: Payer: Commercial Managed Care - PPO | Admitting: Physical Therapy

## 2020-02-20 ENCOUNTER — Ambulatory Visit: Payer: Commercial Managed Care - PPO | Attending: Internal Medicine

## 2020-02-20 DIAGNOSIS — Z23 Encounter for immunization: Secondary | ICD-10-CM | POA: Insufficient documentation

## 2020-02-20 NOTE — Progress Notes (Signed)
   Covid-19 Vaccination Clinic  Name:  Becky Jensen    MRN: [id number] DOB: April 25, 1971  02/20/2020  [name] was observed post Covid-19 immunization for 15 minutes without incidence. She was provided with Vaccine Information Sheet and instruction to access the V-Safe system.   [name] was instructed to call 911 with any severe reactions post vaccine: Marland Kitchen Difficulty breathing  . Swelling of your face and throat  . A fast heartbeat  . A bad rash all over your body  . Dizziness and weakness    Immunizations Administered    Name Date Dose VIS Date Route   Pfizer COVID-19 Vaccine 02/20/2020  4:28 PM 0.3 mL 12/06/2019 Intramuscular   Manufacturer: [place]   Lot: [id number]   [place]: [id number]

## 2020-03-18 ENCOUNTER — Ambulatory Visit: Payer: Commercial Managed Care - PPO | Attending: Internal Medicine

## 2020-03-18 DIAGNOSIS — Z23 Encounter for immunization: Secondary | ICD-10-CM

## 2020-03-18 NOTE — Progress Notes (Signed)
   Covid-19 Vaccination Clinic  Name:  Becky Jensen    MRN: [id number] DOB: 11-Jul-1971  03/18/2020  Becky Jensen was observed post Covid-19 immunization for 15 minutes without incident. She was provided with Vaccine Information Sheet and instruction to access the V-Safe system.   Becky Jensen was instructed to call 911 with any severe reactions post vaccine: Marland Kitchen Difficulty breathing  . Swelling of face and throat  . A fast heartbeat  . A bad rash all over body  . Dizziness and weakness   Immunizations Administered    Name Date Dose VIS Date Route   Pfizer COVID-19 Vaccine 03/18/2020  2:34 PM 0.3 mL 12/06/2019 Intramuscular   Manufacturer: [place]   Lot: [id number]   [place]: [id number]

## 2020-06-24 ENCOUNTER — Other Ambulatory Visit: Payer: Self-pay | Admitting: Registered Nurse

## 2020-06-24 DIAGNOSIS — F419 Anxiety disorder, unspecified: Secondary | ICD-10-CM

## 2020-06-24 DIAGNOSIS — F32A Depression, unspecified: Secondary | ICD-10-CM

## 2020-06-24 NOTE — Telephone Encounter (Signed)
Patient is requesting refill for Fluoxetine 40 mg. Last OV was 06/10/2019 for anxiety/depression and other Dx. Thank you.

## 2020-06-26 ENCOUNTER — Other Ambulatory Visit: Payer: Self-pay

## 2020-06-26 ENCOUNTER — Encounter: Payer: Self-pay | Admitting: Registered Nurse

## 2020-06-26 ENCOUNTER — Ambulatory Visit: Payer: Commercial Managed Care - PPO | Admitting: Registered Nurse

## 2020-06-26 VITALS — BP 134/87 | HR 100 | Temp 98.1°F | Resp 18 | Ht 62.4 in | Wt 188.0 lb

## 2020-06-26 DIAGNOSIS — Z1329 Encounter for screening for other suspected endocrine disorder: Secondary | ICD-10-CM | POA: Diagnosis not present

## 2020-06-26 DIAGNOSIS — Z130 Encounter for screening for diseases of the blood and blood-forming organs and certain disorders involving the immune mechanism: Secondary | ICD-10-CM

## 2020-06-26 DIAGNOSIS — Z13228 Encounter for screening for other metabolic disorders: Secondary | ICD-10-CM | POA: Diagnosis not present

## 2020-06-26 DIAGNOSIS — Z13220 Encounter for screening for lipoid disorders: Secondary | ICD-10-CM | POA: Diagnosis not present

## 2020-06-26 NOTE — Patient Instructions (Signed)
° ° ° °  If you have lab work done today you will be contacted with your lab results within the next 2 weeks.  If you have not heard from us then please contact us. The fastest [place] to get your results is to register for My Chart. ° ° °IF you received an x-ray today, you will receive an invoice from [facility]. Please contact [facility] at [phone] with questions or concerns regarding your invoice.  ° °IF you received labwork today, you will receive an invoice from [facility]. Please contact [facility] at [phone] with questions or concerns regarding your invoice.  ° °Our billing staff will not be able to assist you with questions regarding bills from these companies. ° °You will be contacted with the lab results as soon as they are available. The fastest [place] to get your results is to activate your My Chart account. Instructions are located on the last page of this paperwork. If you have not heard from us regarding the results in 2 weeks, please contact this office. °  ° ° ° °

## 2020-06-27 LAB — LIPID PANEL
Chol/HDL Ratio: 3 ratio (ref 0.0–4.4)
Cholesterol, Total: 283 mg/dL — ABNORMAL HIGH (ref 100–199)
HDL: 93 mg/dL (ref >39)
LDL Chol Calc (NIH): 159 mg/dL — ABNORMAL HIGH (ref 0–99)
Triglycerides: 174 mg/dL — ABNORMAL HIGH (ref 0–149)
VLDL Cholesterol Cal: 31 mg/dL (ref 5–40)

## 2020-06-27 LAB — COMPREHENSIVE METABOLIC PANEL
ALT: 13 IU/L (ref 0–32)
AST: 16 IU/L (ref 0–40)
Albumin/Globulin Ratio: 1.6 (ref 1.2–2.2)
Albumin: 4.6 g/dL (ref 3.8–4.8)
Alkaline Phosphatase: 107 IU/L (ref 48–121)
BUN/Creatinine Ratio: 21 (ref 9–23)
BUN: 13 mg/dL (ref 6–24)
Bilirubin Total: 0.5 mg/dL (ref 0.0–1.2)
CO2: 22 mmol/L (ref 20–29)
Calcium: 9.8 mg/dL (ref 8.7–10.2)
Chloride: 96 mmol/L (ref 96–106)
Creatinine, Ser: 0.62 mg/dL (ref 0.57–1.00)
GFR calc Af Amer: 123 mL/min/{1.73_m2} (ref >59)
GFR calc non Af Amer: 107 mL/min/{1.73_m2} (ref >59)
Globulin, Total: 2.8 g/dL (ref 1.5–4.5)
Glucose: 104 mg/dL — ABNORMAL HIGH (ref 65–99)
Potassium: 4.8 mmol/L (ref 3.5–5.2)
Sodium: 135 mmol/L (ref 134–144)
Total Protein: 7.4 g/dL (ref 6.0–8.5)

## 2020-06-27 LAB — CBC WITH DIFFERENTIAL/PLATELET
Basophils Absolute: 0.1 10*3/uL (ref 0.0–0.2)
Basos: 1 %
EOS (ABSOLUTE): 0.1 10*3/uL (ref 0.0–0.4)
Eos: 1 %
Hematocrit: 43.7 % (ref 34.0–46.6)
Hemoglobin: 15.2 g/dL (ref 11.1–15.9)
Immature Grans (Abs): 0 10*3/uL (ref 0.0–0.1)
Immature Granulocytes: 0 %
Lymphocytes Absolute: 2.4 10*3/uL (ref 0.7–3.1)
Lymphs: 28 %
MCH: 32.4 pg (ref 26.6–33.0)
MCHC: 34.8 g/dL (ref 31.5–35.7)
MCV: 93 fL (ref 79–97)
Monocytes Absolute: 0.8 10*3/uL (ref 0.1–0.9)
Monocytes: 10 %
Neutrophils Absolute: 5.3 10*3/uL (ref 1.4–7.0)
Neutrophils: 60 %
Platelets: 363 10*3/uL (ref 150–450)
RBC: 4.69 x10E6/uL (ref 3.77–5.28)
RDW: 12.3 % (ref 11.7–15.4)
WBC: 8.7 10*3/uL (ref 3.4–10.8)

## 2020-06-27 LAB — HEMOGLOBIN A1C
Est. average glucose Bld gHb Est-mCnc: 108 mg/dL
Hgb A1c MFr Bld: 5.4 % (ref 4.8–5.6)

## 2020-06-27 LAB — TSH: TSH: 2.44 u[IU]/mL (ref 0.450–4.500)

## 2020-06-30 ENCOUNTER — Encounter: Payer: Self-pay | Admitting: Registered Nurse

## 2020-06-30 DIAGNOSIS — E7801 Familial hypercholesterolemia: Secondary | ICD-10-CM

## 2020-06-30 MED ORDER — ATORVASTATIN CALCIUM 40 MG PO TABS: 40.0000 mg | ORAL_TABLET | Freq: Every day | ORAL | 3 refills | Status: DC

## 2020-06-30 NOTE — Progress Notes (Signed)
Cholesterol increased since last labs Increase atorvastatin to 40mg  PO qd Return in 6 mos  Kathrin Ruddy, NP

## 2020-07-27 ENCOUNTER — Encounter: Payer: Self-pay | Admitting: Registered Nurse

## 2020-09-20 ENCOUNTER — Encounter: Payer: Self-pay | Admitting: Registered Nurse

## 2020-09-20 NOTE — Progress Notes (Signed)
Established Patient Office Visit  Subjective:  Patient ID: Becky Jensen, female    DOB: Mar 24, 1971  Age: 49 y.o. MRN: [id number]  CC:  Chief Complaint  Patient presents with  . Medication Refill    Patient is here for medication refill on Prozac. Per patient she has no other questions or concerns.    HPI Becky Jensen presents for med refill on fluoxetine  No concerns. Controls depression well Feels that things have been going better recently than they had in the past year - COVID stresses are settling somewhat Denies hi/si Feeling well overall  Past Medical History:  Diagnosis Date  . Depression   . Hyperlipidemia     Past Surgical History:  Procedure Laterality Date  . ANKLE FRACTURE SURGERY Right 08/2018  . TONSILLECTOMY      Family History  Problem Relation Age of Onset  . Hyperlipidemia Mother   . Hypertension Father   . Cancer Maternal Grandmother 80       stomach  . Heart disease Maternal Grandfather   . Cancer Paternal Grandmother        NH lymphoma  . Hypertension Paternal Grandfather   . Stroke Paternal Grandfather   . Breast cancer Maternal Aunt   . Breast cancer Cousin     Social History   Socioeconomic History  . Marital status: Married    Spouse name: Not on file  . Number of children: 0  . Years of education: Not on file  . Highest education level: Not on file  Occupational History  . 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 Smoking status: Former Smoker    Quit date: 06/09/2009    Years since quitting: 11.2  . Smokeless tobacco: Never Used  . Tobacco comment: sporadic  Vaping Use  . Vaping Use: Never used  Substance and Sexual Activity  . Alcohol use: Yes    Comment: 10 drinks per week  . Drug use: No  . Sexual activity: Yes    Partners: Male    Birth control/protection: None  Other Topics Concern  . Not on file  Social History Narrative   Pt is from [place]. Moved to [place] in 2004. Lives at home with  husband.    Social Determinants of Health   Financial Resource Strain:   . Difficulty of Paying Living Expenses: Not on file  Food Insecurity:   . Worried About Charity fundraiser in the Last Year: Not on file  . Ran Out of Food in the Last Year: Not on file  Transportation Needs:   . Lack of Transportation (Medical): Not on file  . Lack of Transportation (Non-Medical): Not on file  Physical Activity:   . Days of Exercise per Week: Not on file  . Minutes of Exercise per Session: Not on file  Stress:   . Feeling of Stress : Not on file  Social Connections:   . Frequency of Communication with Friends and Family: Not on file  . Frequency of Social Gatherings with Friends and Family: Not on file  . Attends Religious Services: Not on file  . Active Member of Clubs or Organizations: Not on file  . Attends Archivist Meetings: Not on file  . Marital Status: Not on file  Intimate Partner Violence:   . Fear of Current or Ex-Partner: Not on file  . Emotionally Abused: Not on file  . Physically Abused: Not on file  . Sexually  Abused: Not on file    Outpatient Medications Prior to Visit  Medication Sig Dispense Refill  . cyclobenzaprine (FLEXERIL) 5 MG tablet Take 1 tablet (5 mg total) by mouth 3 (three) times daily as needed for muscle spasms. 60 tablet 0  . FLUoxetine (PROZAC) 40 MG capsule TAKE 1 CAPSULE(40 MG) BY MOUTH DAILY 90 capsule 3  . ibuprofen (ADVIL,MOTRIN) 200 MG tablet Take 400 mg by mouth every 6 (six) hours as needed for moderate pain.    Marland Kitchen lisdexamfetamine (VYVANSE) 30 MG capsule     . Multiple Vitamin (MULTIVITAMIN WITH MINERALS) TABS tablet Take 1 tablet by mouth daily.    Marland Kitchen atorvastatin (LIPITOR) 20 MG tablet Take 1 tablet (20 mg total) by mouth daily. 90 tablet 3   No facility-administered medications prior to visit.    No Known Allergies  ROS Review of Systems  Constitutional: Negative.   HENT: Negative.   Eyes: Negative.   Respiratory: Negative.    Cardiovascular: Negative.   Gastrointestinal: Negative.   Genitourinary: Negative.   Musculoskeletal: Negative.   Skin: Negative.   Neurological: Negative.   Psychiatric/Behavioral: Negative.       Objective:    Physical Exam Vitals and nursing note reviewed.  Constitutional:      General: She is not in acute distress.    Appearance: Normal appearance. She is normal weight. She is not ill-appearing, toxic-appearing or diaphoretic.  Cardiovascular:     Rate and Rhythm: Normal rate and regular rhythm.     Heart sounds: Normal heart sounds. No murmur heard.  No friction rub. No gallop.   Pulmonary:     Effort: Pulmonary effort is normal. No respiratory distress.     Breath sounds: Normal breath sounds. No stridor. No wheezing, rhonchi or rales.  Chest:     Chest wall: No tenderness.  Skin:    General: Skin is warm and dry.  Neurological:     General: No focal deficit present.     Mental Status: She is alert and oriented to person, place, and time. Mental status is at baseline.  Psychiatric:        Mood and Affect: Mood normal.        Behavior: Behavior normal.        Thought Content: Thought content normal.        Judgment: Judgment normal.     BP 134/87   Pulse 100   Temp 98.1 F (36.7 C) (Temporal)   Resp 18   Ht 5' 2.4" (1.585 m)   Wt 188 lb (85.3 kg)   SpO2 97%   BMI 33.95 kg/m  Wt Readings from Last 3 Encounters:  06/26/20 188 lb (85.3 kg)  10/02/19 191 lb (86.6 kg)  06/10/19 202 lb (91.6 kg)     Health Maintenance Due  Topic Date Due  . INFLUENZA VACCINE  07/26/2020  . PAP SMEAR-Modifier  08/03/2020    There are no preventive care reminders to display for this patient.  Lab Results  Component Value Date   TSH 2.440 06/26/2020   Lab Results  Component Value Date   WBC 8.7 06/26/2020   HGB 15.2 06/26/2020   HCT 43.7 06/26/2020   MCV 93 06/26/2020   PLT 363 06/26/2020   Lab Results  Component Value Date   NA 135 06/26/2020   K 4.8  06/26/2020   CO2 22 06/26/2020   GLUCOSE 104 (H) 06/26/2020   BUN 13 06/26/2020   CREATININE 0.62 06/26/2020   BILITOT 0.5 06/26/2020  ALKPHOS 107 06/26/2020   AST 16 06/26/2020   ALT 13 06/26/2020   PROT 7.4 06/26/2020   ALBUMIN 4.6 06/26/2020   CALCIUM 9.8 06/26/2020   Lab Results  Component Value Date   CHOL 283 (H) 06/26/2020   Lab Results  Component Value Date   HDL 93 06/26/2020   Lab Results  Component Value Date   LDLCALC 159 (H) 06/26/2020   Lab Results  Component Value Date   TRIG 174 (H) 06/26/2020   Lab Results  Component Value Date   CHOLHDL 3.0 06/26/2020   Lab Results  Component Value Date   HGBA1C 5.4 06/26/2020      Assessment & Plan:   Problem List Items Addressed This Visit    None    Visit Diagnoses    Screening for endocrine, metabolic and immunity disorder    -  Primary   Relevant Orders   TSH (Completed)   Hemoglobin A1c (Completed)   CBC with Differential (Completed)   Comprehensive metabolic panel (Completed)   Lipid screening       Relevant Orders   Lipid panel (Completed)      No orders of the defined types were placed in this encounter.   Follow-up: No follow-ups on file.   PLAN  Routine labs collected  Will refill x 1 year  Return in 1 year or sooner if needed  Patient encouraged to call clinic with any questions, comments, or concerns.  Maximiano Coss, NP

## 2020-10-26 ENCOUNTER — Other Ambulatory Visit: Payer: Self-pay | Admitting: Registered Nurse

## 2020-10-26 DIAGNOSIS — E7801 Familial hypercholesterolemia: Secondary | ICD-10-CM

## 2021-01-15 ENCOUNTER — Other Ambulatory Visit: Payer: Self-pay | Admitting: Registered Nurse

## 2021-01-15 DIAGNOSIS — Z1231 Encounter for screening mammogram for malignant neoplasm of breast: Secondary | ICD-10-CM

## 2021-02-04 DIAGNOSIS — Z1231 Encounter for screening mammogram for malignant neoplasm of breast: Secondary | ICD-10-CM

## 2021-03-08 ENCOUNTER — Other Ambulatory Visit: Payer: Self-pay | Admitting: Family Medicine

## 2021-03-08 DIAGNOSIS — Z1231 Encounter for screening mammogram for malignant neoplasm of breast: Secondary | ICD-10-CM

## 2021-03-17 ENCOUNTER — Ambulatory Visit: Payer: Commercial Managed Care - PPO | Admitting: Registered Nurse

## 2021-04-12 ENCOUNTER — Inpatient Hospital Stay: Admission: RE | Admit: 2021-04-12 | Payer: Commercial Managed Care - PPO | Source: Ambulatory Visit

## 2021-04-12 DIAGNOSIS — Z1231 Encounter for screening mammogram for malignant neoplasm of breast: Secondary | ICD-10-CM

## 2021-06-16 ENCOUNTER — Other Ambulatory Visit: Payer: Self-pay | Admitting: Registered Nurse

## 2021-06-16 ENCOUNTER — Telehealth: Payer: Self-pay

## 2021-06-16 DIAGNOSIS — Z1231 Encounter for screening mammogram for malignant neoplasm of breast: Secondary | ICD-10-CM

## 2021-06-16 NOTE — Telephone Encounter (Signed)
Patient scheduled an appointment through [facility] for a New Patient appointment, but actually wants to do a TOC from [facility] to [employer]

## 2021-06-17 ENCOUNTER — Telehealth: Payer: Self-pay

## 2021-06-17 NOTE — Telephone Encounter (Signed)
Patient is scheduled   

## 2021-06-17 NOTE — Telephone Encounter (Signed)
LVM to make appt with Richard Marrow 06/23

## 2021-06-18 ENCOUNTER — Ambulatory Visit
Admission: RE | Admit: 2021-06-18 | Discharge: 2021-06-18 | Disposition: A | Discharge status: Home / Self Care | Payer: Commercial Managed Care - PPO | Source: Ambulatory Visit

## 2021-06-18 ENCOUNTER — Other Ambulatory Visit: Payer: Self-pay

## 2021-06-18 DIAGNOSIS — Z1231 Encounter for screening mammogram for malignant neoplasm of breast: Secondary | ICD-10-CM

## 2021-07-01 ENCOUNTER — Encounter: Payer: Self-pay | Admitting: Physician Assistant

## 2021-07-01 ENCOUNTER — Other Ambulatory Visit: Payer: Self-pay

## 2021-07-01 ENCOUNTER — Ambulatory Visit: Payer: Commercial Managed Care - PPO | Admitting: Physician Assistant

## 2021-07-01 VITALS — BP 130/86 | HR 92 | Temp 97.7°F | Resp 98 | Ht 62.5 in | Wt 198.2 lb

## 2021-07-01 DIAGNOSIS — E669 Obesity, unspecified: Secondary | ICD-10-CM

## 2021-07-01 DIAGNOSIS — R5383 Other fatigue: Secondary | ICD-10-CM

## 2021-07-01 DIAGNOSIS — E785 Hyperlipidemia, unspecified: Secondary | ICD-10-CM | POA: Diagnosis not present

## 2021-07-01 DIAGNOSIS — Z131 Encounter for screening for diabetes mellitus: Secondary | ICD-10-CM | POA: Diagnosis not present

## 2021-07-01 LAB — TSH: TSH: 2.58 u[IU]/mL (ref 0.35–5.50)

## 2021-07-01 LAB — COMPREHENSIVE METABOLIC PANEL
ALT: 17 U/L (ref 0–35)
AST: 21 U/L (ref 0–37)
Albumin: 4.2 g/dL (ref 3.5–5.2)
Alkaline Phosphatase: 81 U/L (ref 39–117)
BUN: 8 mg/dL (ref 6–23)
CO2: 26 mEq/L (ref 19–32)
Calcium: 9.3 mg/dL (ref 8.4–10.5)
Chloride: 99 mEq/L (ref 96–112)
Creatinine, Ser: 0.65 mg/dL (ref 0.40–1.20)
GFR: 103.1 mL/min (ref >60.00)
Glucose, Bld: 86 mg/dL (ref 70–99)
Potassium: 3.8 mEq/L (ref 3.5–5.1)
Sodium: 137 mEq/L (ref 135–145)
Total Bilirubin: 0.3 mg/dL (ref 0.2–1.2)
Total Protein: 7.1 g/dL (ref 6.0–8.3)

## 2021-07-01 LAB — CBC WITH DIFFERENTIAL/PLATELET
Basophils Absolute: 0.1 10*3/uL (ref 0.0–0.1)
Basophils Relative: 0.9 % (ref 0.0–3.0)
Eosinophils Absolute: 0.1 10*3/uL (ref 0.0–0.7)
Eosinophils Relative: 1.3 % (ref 0.0–5.0)
HCT: 42.5 % (ref 36.0–46.0)
Hemoglobin: 14.4 g/dL (ref 12.0–15.0)
Lymphocytes Relative: 34.4 % (ref 12.0–46.0)
Lymphs Abs: 2.4 10*3/uL (ref 0.7–4.0)
MCHC: 34 g/dL (ref 30.0–36.0)
MCV: 96.2 fl (ref 78.0–100.0)
Monocytes Absolute: 0.6 10*3/uL (ref 0.1–1.0)
Monocytes Relative: 9 % (ref 3.0–12.0)
Neutro Abs: 3.8 10*3/uL (ref 1.4–7.7)
Neutrophils Relative %: 54.4 % (ref 43.0–77.0)
Platelets: 360 10*3/uL (ref 150.0–400.0)
RBC: 4.42 Mil/uL (ref 3.87–5.11)
RDW: 12.9 % (ref 11.5–15.5)
WBC: 7 10*3/uL (ref 4.0–10.5)

## 2021-07-01 LAB — VITAMIN D 25 HYDROXY (VIT D DEFICIENCY, FRACTURES): VITD: 26.22 ng/mL — ABNORMAL LOW (ref 30.00–100.00)

## 2021-07-01 LAB — LIPID PANEL
Cholesterol: 291 mg/dL — ABNORMAL HIGH (ref 0–200)
HDL: 94.5 mg/dL (ref >39.00)
LDL Cholesterol: 162 mg/dL — ABNORMAL HIGH (ref 0–99)
NonHDL: 196.94
Total CHOL/HDL Ratio: 3
Triglycerides: 177 mg/dL — ABNORMAL HIGH (ref 0.0–149.0)
VLDL: 35.4 mg/dL (ref 0.0–40.0)

## 2021-07-01 LAB — VITAMIN B12: Vitamin B-12: 384 pg/mL (ref 211–911)

## 2021-07-01 LAB — HEMOGLOBIN A1C: Hgb A1c MFr Bld: 5.5 % (ref 4.6–6.5)

## 2021-07-01 NOTE — Progress Notes (Signed)
Established Patient Office Visit  Subjective:  Patient ID: Becky Jensen, female    DOB: April 20, 1971  Age: 50 y.o. MRN: [id number]  CC:  Chief Complaint  Patient presents with   Menopause   Obesity   Hyperlipidemia    HPI Becky Jensen presents for several concerns.   She needs cholesterol rechecked and possibly started back on Lipitor. She has not taken this in awhile. Mom, brother, sister, all have issues with high cholesterol as well.   Also having trouble with her weight. Today is highest weight to date per patient. Previously before ankle surgery in 2019, her average was in the 170s. States she grew up in [place]. and had to walk everywhere. Noticed starting to have issues in her 67s. She is currently tracking on [employer] (and has had success with this in the past). Walking around the neighborhood right now with the dogs, otherwise no aerobic exercise. Enjoys beer and wine. Enjoys eating without strict limitations.   Cycles have been infrequent in the last few months. She is experiencing lower energy in the day.  She sees a therapist & neurologist at [facility], who fills her Prozac and Vyvanse for adult ADD.   Past Medical History:  Diagnosis Date   Depression    Hyperlipidemia     Past Surgical History:  Procedure Laterality Date   ANKLE FRACTURE SURGERY Right 08/2018   TONSILLECTOMY      Family History  Problem Relation Age of Onset   Hyperlipidemia Mother    Hypertension Father    Cancer Maternal Grandmother 14       stomach   Heart disease Maternal Grandfather    Cancer Paternal Grandmother        NH lymphoma   Hypertension Paternal Grandfather    Stroke Paternal Grandfather    Breast cancer Maternal Aunt    Breast cancer Cousin     Social History   Socioeconomic History   Marital status: Married    Spouse name: Not on file   Number of children: 0   Years of education: Not on file   Highest education level: Not on file   Occupational History   Occupation: Pharmacist, 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 Moved to [place] in 2004. Lives at home with husband.    Social Determinants of Health   Financial Resource Strain: Not on file  Food Insecurity: Not on file  Transportation Needs: Not on file  Physical Activity: Not on file  Stress: Not on file  Social Connections: Not on file  Intimate Partner Violence: Not on file    Outpatient Medications Prior to Visit  Medication Sig Dispense Refill   FLUoxetine (PROZAC) 40 MG capsule TAKE 1 CAPSULE(40 MG) BY MOUTH DAILY 90 capsule 3   lisdexamfetamine (VYVANSE) 30 MG capsule  Multiple Vitamin (MULTIVITAMIN WITH MINERALS) TABS tablet Take 1 tablet by mouth daily.     atorvastatin (LIPITOR) 40 MG tablet Take 1 tablet (40 mg total) by mouth daily. 90 tablet 3   cyclobenzaprine (FLEXERIL) 5 MG tablet Take 1 tablet (5 mg total) by mouth 3 (three) times daily as needed for muscle spasms. 60 tablet 0   ibuprofen (ADVIL,MOTRIN) 200 MG tablet Take 400 mg by mouth every 6 (six) hours as needed for moderate pain.     No facility-administered medications prior to visit.    No Known Allergies  ROS Review of Systems  Constitutional:  Positive for fatigue.  Psychiatric/Behavioral:  Positive for decreased concentration.   All other systems reviewed and are negative.    Objective:    Physical Exam Vitals and nursing note reviewed.  Constitutional:      General: She is not in acute  distress.    Appearance: Normal appearance. She is obese.  HENT:     Head: Normocephalic.     Right Ear: External ear normal.     Left Ear: External ear normal.     Nose: Nose normal.  Eyes:     Extraocular Movements: Extraocular movements intact.     Pupils: Pupils are equal, round, and reactive to light.  Cardiovascular:     Rate and Rhythm: Normal rate and regular rhythm.     Pulses: Normal pulses.  Pulmonary:     Effort: Pulmonary effort is normal.     Breath sounds: Normal breath sounds.  Musculoskeletal:     Cervical back: Normal range of motion.  Neurological:     General: No focal deficit present.     Mental Status: She is alert.  Psychiatric:        Mood and Affect: Mood normal.        Behavior: Behavior normal.    BP 130/86   Pulse 92   Temp 97.7 F (36.5 C)   Resp (!) 98   Ht 5' 2.5" (1.588 m)   Wt 198 lb 3.2 oz (89.9 kg)   LMP 05/28/2021   BMI 35.67 kg/m  Wt Readings from Last 3 Encounters:  07/01/21 198 lb 3.2 oz (89.9 kg)  06/26/20 188 lb (85.3 kg)  10/02/19 191 lb (86.6 kg)     Health Maintenance Due  Topic Date Due   COLONOSCOPY (Pts 45-29yrs Insurance coverage will need to be confirmed)  Never done   PAP SMEAR-Modifier  08/03/2020    There are no preventive care reminders to display for this patient.  Lab Results  Component Value Date   TSH 2.440 06/26/2020   Lab Results  Component Value Date   WBC 8.7 06/26/2020   HGB 15.2 06/26/2020   HCT 43.7 06/26/2020   MCV 93 06/26/2020   PLT 363 06/26/2020   Lab Results  Component Value Date   NA 135 06/26/2020   K 4.8 06/26/2020   CO2 22 06/26/2020   GLUCOSE 104 (H) 06/26/2020   BUN 13 06/26/2020   CREATININE 0.62 06/26/2020   BILITOT 0.5 06/26/2020   ALKPHOS 107 06/26/2020   AST 16 06/26/2020   ALT 13 06/26/2020   PROT 7.4 06/26/2020   ALBUMIN 4.6 06/26/2020   CALCIUM 9.8 06/26/2020   Lab Results  Component Value Date   CHOL 283 (H) 06/26/2020   Lab Results  Component Value  Date   HDL 93 06/26/2020   Lab Results  Component Value Date   LDLCALC 159 (H) 06/26/2020   Lab  Results  Component Value Date   TRIG 174 (H) 06/26/2020   Lab Results  Component Value Date   CHOLHDL 3.0 06/26/2020   Lab Results  Component Value Date   HGBA1C 5.4 06/26/2020      Assessment & Plan:   Problem List Items Addressed This Visit   None Visit Diagnoses     Obesity, Class II, BMI 35-39.9    -  Primary   Relevant Orders   Amb Ref to Medical Weight Management   Other fatigue       Relevant Orders   CBC with Differential/Platelet   TSH   VITAMIN D 25 Hydroxy (Vit-D Deficiency, Fractures)   Vitamin B12   Hemoglobin A1c   Diabetes mellitus screening       Relevant Orders   Comprehensive metabolic panel   Hemoglobin A1c   Dyslipidemia       Relevant Orders   Lipid panel       No orders of the defined types were placed in this encounter.   Follow-up: Return in about 4 weeks (around 07/29/2021) for Well Woman Exam .   Labs today and will treat accordingly. Also sent referral to see Dr. Juleen China about weight management. Discussed several lifestyle changes with her today as well as provided info in AVS for her. Needs to increase cardio activity, decrease carbs, and continue to track intake.   Total time spent on encounter was 35 minutes including H & P, prior lab review, medication review, and documentation.   Jilian West M Nikhil Osei, PA-C

## 2021-07-01 NOTE — Patient Instructions (Signed)
Good to meet you! See you back for a well woman exam. Labs today, will send results in [place]. Referral to Dr. Juleen China for weight management discussion.

## 2021-07-03 ENCOUNTER — Encounter: Payer: Self-pay | Admitting: Physician Assistant

## 2021-07-05 MED ORDER — ATORVASTATIN CALCIUM 20 MG PO TABS: 20.0000 mg | ORAL_TABLET | Freq: Every day | ORAL | 0 refills | Status: DC

## 2021-07-05 NOTE — Telephone Encounter (Signed)
Please see message and advise what dosage of Lipitor you would like?

## 2021-07-08 ENCOUNTER — Other Ambulatory Visit: Payer: Self-pay

## 2021-07-08 ENCOUNTER — Encounter: Payer: Self-pay | Admitting: Physician Assistant

## 2021-07-08 ENCOUNTER — Ambulatory Visit (INDEPENDENT_AMBULATORY_CARE_PROVIDER_SITE_OTHER): Payer: Commercial Managed Care - PPO | Admitting: Physician Assistant

## 2021-07-08 ENCOUNTER — Other Ambulatory Visit (HOSPITAL_COMMUNITY)
Admission: RE | Admit: 2021-07-08 | Discharge: 2021-07-08 | Disposition: A | Discharge status: Home / Self Care | Payer: Commercial Managed Care - PPO | Source: Ambulatory Visit | Attending: Physician Assistant | Admitting: Physician Assistant

## 2021-07-08 VITALS — BP 134/85 | HR 87 | Temp 98.0°F | Ht 62.5 in | Wt 195.2 lb

## 2021-07-08 DIAGNOSIS — Z01419 Encounter for gynecological examination (general) (routine) without abnormal findings: Secondary | ICD-10-CM | POA: Diagnosis present

## 2021-07-08 DIAGNOSIS — Z1211 Encounter for screening for malignant neoplasm of colon: Secondary | ICD-10-CM | POA: Diagnosis not present

## 2021-07-08 NOTE — Progress Notes (Signed)
Established Patient Office Visit  Subjective:  Patient ID: Becky Jensen, female    DOB: July 17, 1971  Age: 50 y.o. MRN: [id number]  CC:  Chief Complaint  Patient presents with   Annual Exam    HPI Becky Jensen presents for well woman exam.  Acute concerns: None  Health maintenance: Lifestyle/ exercise: Weight Watcher, walking Nutrition: Counting calories, tracking at home Mental health: Stable on current regimen Caffeine: 2 cups coffee in the morning  Sleep: Sleeps well per patient, occasional snoring no gasping  Substance use: Beer and wine occasionally, otherwise none  Menstrual cycles: Irregular in the last few months  Immunizations: Consider Shingrix, otherwise UTD  Colonoscopy: Never done, will send referral today  Pap: Today  Mammogram: UTD - last month was normal    Past Medical History:  Diagnosis Date   Depression    Hyperlipidemia     Past Surgical History:  Procedure Laterality Date   ANKLE FRACTURE SURGERY Right 08/2018   TONSILLECTOMY      Family History  Problem Relation Age of Onset   Hyperlipidemia Mother    Hypertension Father    Cancer Maternal Grandmother 91       stomach   Heart disease Maternal Grandfather    Cancer Paternal Grandmother        NH lymphoma   Hypertension Paternal Grandfather    Stroke Paternal Grandfather    Breast cancer Maternal Aunt    Breast cancer Cousin     Social History   Socioeconomic History   Marital status: Married    Spouse name: Not on file   Number of children: 0   Years of education: Not on file   Highest education level: Not on file  Occupational History   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 Moved to [place] in 2004. Lives at home with husband.    Social Determinants of Health   Financial Resource Strain: Not on file  Food Insecurity: Not on file  Transportation Needs: Not on file  Physical Activity: Not on file  Stress: Not on file  Social Connections: Not on file  Intimate Partner Violence: Not on file    Outpatient Medications Prior to Visit  Medication Sig Dispense Refill   atorvastatin (LIPITOR) 20 MG tablet Take 1 tablet (20 mg total) by mouth daily. 30 tablet 0   ergocalciferol (VITAMIN D2) 1.25 MG (50000 UT) capsule Take 50,000 Units by mouth once a week.     FLUoxetine (PROZAC) 40 MG capsule TAKE 1 CAPSULE(40 MG) BY MOUTH DAILY 90 capsule 3   lisdexamfetamine (VYVANSE) 30 MG capsule Take 30 mg by mouth daily as needed.     Multiple Vitamin (MULTIVITAMIN WITH MINERALS) TABS tablet Take 1 tablet by mouth daily.     No facility-administered medications prior to visit.    No Known Allergies  ROS Review  of Systems  Constitutional:  Negative for activity change, appetite change, fever and unexpected weight change.  HENT:  Negative for congestion.   Eyes:  Negative for visual disturbance.  Respiratory:  Negative for apnea, cough and shortness of breath.   Cardiovascular:  Negative for chest pain, palpitations and leg swelling.  Gastrointestinal:  Negative for abdominal pain, blood in stool, constipation and diarrhea.  Endocrine: Negative for polydipsia, polyphagia and polyuria.  Genitourinary:  Negative for dysuria and pelvic pain.  Musculoskeletal:  Negative for arthralgias.  Skin:  Negative for rash.  Neurological:  Negative for dizziness, weakness and headaches.  Hematological:  Negative for adenopathy. Does not bruise/bleed easily.  Psychiatric/Behavioral:  Negative for sleep  disturbance and suicidal ideas. The patient is not nervous/anxious.      Objective:    Physical Exam Vitals and nursing note reviewed. Exam conducted with a chaperone present.  Constitutional:      General: She is not in acute distress.    Appearance: Normal appearance. She is obese.  HENT:     Head: Normocephalic.     Right Ear: Tympanic membrane, ear canal and external ear normal.     Left Ear: Tympanic membrane, ear canal and external ear normal.     Nose: Nose normal.     Mouth/Throat:     Mouth: Mucous membranes are moist.     Pharynx: Oropharynx is clear.  Eyes:     Extraocular Movements: Extraocular movements intact.     Conjunctiva/sclera: Conjunctivae normal.     Pupils: Pupils are equal, round, and reactive to light.  Cardiovascular:     Rate and Rhythm: Normal rate and regular rhythm.     Pulses: Normal pulses.     Heart sounds: No murmur heard. Pulmonary:     Effort: Pulmonary effort is normal. No respiratory distress.     Breath sounds: Normal breath sounds.  Chest:  Breasts:    Right: Normal. No mass, nipple discharge, tenderness, axillary adenopathy or supraclavicular adenopathy.     Left: Normal. No mass, nipple discharge, tenderness, axillary adenopathy or supraclavicular adenopathy.  Abdominal:     General: Abdomen is flat.     Palpations: Abdomen is soft.     Tenderness: There is no abdominal tenderness.  Genitourinary:    General: Normal vulva.     Labia:        Right: No tenderness.        Left: No tenderness.      Urethra: No prolapse or urethral pain.     Vagina: Normal.     Cervix: Normal.     Uterus: Normal.      Adnexa: Right adnexa normal and left adnexa normal.  Musculoskeletal:        General: Normal range of motion.     Cervical back: Normal range of motion and neck supple.  Lymphadenopathy:     Upper Body:     Right upper body: No supraclavicular, axillary or pectoral adenopathy.     Left upper body: No supraclavicular, axillary or  pectoral adenopathy.     Lower Body: No right inguinal adenopathy. No left inguinal adenopathy.  Skin:    General: Skin is warm.  Neurological:     General: No focal deficit present.     Mental Status: She is alert.     Cranial Nerves: No cranial nerve deficit.  Psychiatric:        Mood and Affect: Mood normal.        Behavior: Behavior normal.  BP 134/85   Pulse 87   Temp 98 F (36.7 C)   Ht 5' 2.5" (1.588 m)   Wt 195 lb 3.2 oz (88.5 kg)   LMP 07/03/2021   SpO2 97%   BMI 35.13 kg/m  Wt Readings from Last 3 Encounters:  07/08/21 195 lb 3.2 oz (88.5 kg)  07/01/21 198 lb 3.2 oz (89.9 kg)  06/26/20 188 lb (85.3 kg)     Health Maintenance Due  Topic Date Due   COLONOSCOPY (Pts 45-58yrs Insurance coverage will need to be confirmed)  Never done   PAP SMEAR-Modifier  08/03/2020    There are no preventive care reminders to display for this patient.  Lab Results  Component Value Date   TSH 2.58 07/01/2021   Lab Results  Component Value Date   WBC 7.0 07/01/2021   HGB 14.4 07/01/2021   HCT 42.5 07/01/2021   MCV 96.2 07/01/2021   PLT 360.0 07/01/2021   Lab Results  Component Value Date   NA 137 07/01/2021   K 3.8 07/01/2021   CO2 26 07/01/2021   GLUCOSE 86 07/01/2021   BUN 8 07/01/2021   CREATININE 0.65 07/01/2021   BILITOT 0.3 07/01/2021   ALKPHOS 81 07/01/2021   AST 21 07/01/2021   ALT 17 07/01/2021   PROT 7.1 07/01/2021   ALBUMIN 4.2 07/01/2021   CALCIUM 9.3 07/01/2021   GFR 103.10 07/01/2021   Lab Results  Component Value Date   CHOL 291 (H) 07/01/2021   Lab Results  Component Value Date   HDL 94.50 07/01/2021   Lab Results  Component Value Date   LDLCALC 162 (H) 07/01/2021   Lab Results  Component Value Date   TRIG 177.0 (H) 07/01/2021   Lab Results  Component Value Date   CHOLHDL 3 07/01/2021   Lab Results  Component Value Date   HGBA1C 5.5 07/01/2021      Assessment & Plan:   Problem List Items Addressed This Visit    None Visit Diagnoses     Well woman exam with routine gynecological exam    -  Primary   Relevant Orders   Cytology - PAP   Screening for colon cancer       Relevant Orders   Ambulatory referral to Gastroenterology       No orders of the defined types were placed in this encounter.   Follow-up: Return in about 6 months (around 01/08/2022) for cholesterol recheck & med check .   1. Well woman exam with routine gynecological exam Age-appropriate screening and counseling performed today. Pap smear performed in office. AVS provided with preventive counseling.    2. Screening for colon cancer Referral sent for colonoscopy.   Chara Marquard M Archer Vise, PA-C

## 2021-07-08 NOTE — Patient Instructions (Signed)
Good to see you again today! Results of pap to be placed in MyChart. Someone will call about colonoscopy referral. See you back in 6 months. Call if any concerns sooner.

## 2021-07-13 ENCOUNTER — Encounter: Payer: Self-pay | Admitting: Physician Assistant

## 2021-07-13 LAB — CYTOLOGY - PAP
Adequacy: ABSENT
Diagnosis: NEGATIVE

## 2021-07-14 ENCOUNTER — Other Ambulatory Visit: Payer: Self-pay

## 2021-07-14 MED ORDER — METRONIDAZOLE 500 MG PO TABS: 500.0000 mg | ORAL_TABLET | Freq: Two times a day (BID) | ORAL | 0 refills | Status: AC

## 2021-07-29 ENCOUNTER — Other Ambulatory Visit: Payer: Self-pay | Admitting: Registered Nurse

## 2021-07-29 DIAGNOSIS — F419 Anxiety disorder, unspecified: Secondary | ICD-10-CM

## 2021-07-29 DIAGNOSIS — F32A Depression, unspecified: Secondary | ICD-10-CM

## 2021-07-30 ENCOUNTER — Encounter: Payer: Self-pay | Admitting: Physician Assistant

## 2021-07-30 ENCOUNTER — Other Ambulatory Visit: Payer: Self-pay

## 2021-07-30 DIAGNOSIS — F32A Depression, unspecified: Secondary | ICD-10-CM

## 2021-07-30 DIAGNOSIS — F419 Anxiety disorder, unspecified: Secondary | ICD-10-CM

## 2021-07-30 MED ORDER — FLUOXETINE HCL 40 MG PO CAPS: ORAL_CAPSULE | ORAL | 1 refills | Status: DC

## 2021-07-31 ENCOUNTER — Encounter: Payer: Self-pay | Admitting: Physician Assistant

## 2021-09-06 ENCOUNTER — Encounter: Payer: Self-pay | Admitting: Physician Assistant

## 2021-09-06 ENCOUNTER — Other Ambulatory Visit: Payer: Self-pay

## 2021-09-06 MED ORDER — ATORVASTATIN CALCIUM 20 MG PO TABS: 20.0000 mg | ORAL_TABLET | Freq: Every day | ORAL | 1 refills | Status: DC

## 2021-09-07 ENCOUNTER — Encounter: Payer: Self-pay | Admitting: Gastroenterology

## 2021-10-22 ENCOUNTER — Encounter: Payer: Self-pay | Admitting: Physician Assistant

## 2021-10-22 ENCOUNTER — Encounter: Payer: Self-pay | Admitting: Gastroenterology

## 2021-11-04 ENCOUNTER — Encounter: Payer: Commercial Managed Care - PPO | Admitting: Gastroenterology

## 2021-12-22 ENCOUNTER — Encounter (INDEPENDENT_AMBULATORY_CARE_PROVIDER_SITE_OTHER): Payer: Self-pay

## 2022-01-10 ENCOUNTER — Ambulatory Visit: Payer: Commercial Managed Care - PPO | Admitting: Physician Assistant

## 2022-01-11 ENCOUNTER — Encounter (INDEPENDENT_AMBULATORY_CARE_PROVIDER_SITE_OTHER): Payer: Self-pay

## 2022-03-10 ENCOUNTER — Ambulatory Visit: Payer: Commercial Managed Care - PPO | Admitting: Physician Assistant

## 2022-03-10 VITALS — Temp 99.4°F | Ht 62.5 in | Wt 204.0 lb

## 2022-03-10 DIAGNOSIS — Z131 Encounter for screening for diabetes mellitus: Secondary | ICD-10-CM

## 2022-03-10 DIAGNOSIS — E669 Obesity, unspecified: Secondary | ICD-10-CM

## 2022-03-10 DIAGNOSIS — E559 Vitamin D deficiency, unspecified: Secondary | ICD-10-CM

## 2022-03-10 DIAGNOSIS — F32A Depression, unspecified: Secondary | ICD-10-CM

## 2022-03-10 DIAGNOSIS — Z1211 Encounter for screening for malignant neoplasm of colon: Secondary | ICD-10-CM

## 2022-03-10 DIAGNOSIS — E785 Hyperlipidemia, unspecified: Secondary | ICD-10-CM

## 2022-03-10 DIAGNOSIS — F419 Anxiety disorder, unspecified: Secondary | ICD-10-CM

## 2022-03-10 LAB — COMPREHENSIVE METABOLIC PANEL
ALT: 14 U/L (ref 0–35)
AST: 18 U/L (ref 0–37)
Albumin: 4.3 g/dL (ref 3.5–5.2)
Alkaline Phosphatase: 87 U/L (ref 39–117)
BUN: 13 mg/dL (ref 6–23)
CO2: 25 mEq/L (ref 19–32)
Calcium: 9.6 mg/dL (ref 8.4–10.5)
Chloride: 98 mEq/L (ref 96–112)
Creatinine, Ser: 0.57 mg/dL (ref 0.40–1.20)
GFR: 105.9 mL/min (ref >60.00)
Glucose, Bld: 96 mg/dL (ref 70–99)
Potassium: 4.2 mEq/L (ref 3.5–5.1)
Sodium: 134 mEq/L — ABNORMAL LOW (ref 135–145)
Total Bilirubin: 0.4 mg/dL (ref 0.2–1.2)
Total Protein: 6.9 g/dL (ref 6.0–8.3)

## 2022-03-10 LAB — VITAMIN D 25 HYDROXY (VIT D DEFICIENCY, FRACTURES): VITD: 27.01 ng/mL — ABNORMAL LOW (ref 30.00–100.00)

## 2022-03-10 LAB — LIPID PANEL
Cholesterol: 263 mg/dL — ABNORMAL HIGH (ref 0–200)
HDL: 88.6 mg/dL (ref >39.00)
LDL Cholesterol: 141 mg/dL — ABNORMAL HIGH (ref 0–99)
NonHDL: 174.24
Total CHOL/HDL Ratio: 3
Triglycerides: 165 mg/dL — ABNORMAL HIGH (ref 0.0–149.0)
VLDL: 33 mg/dL (ref 0.0–40.0)

## 2022-03-10 LAB — HEMOGLOBIN A1C: Hgb A1c MFr Bld: 5.8 % (ref 4.6–6.5)

## 2022-03-10 MED ORDER — ATORVASTATIN CALCIUM 20 MG PO TABS: 20.0000 mg | ORAL_TABLET | Freq: Every day | ORAL | 1 refills | Status: DC

## 2022-03-10 MED ORDER — ERGOCALCIFEROL 1.25 MG (50000 UT) PO CAPS: 50000.0000 [IU] | ORAL_CAPSULE | ORAL | 0 refills | Status: DC

## 2022-03-10 NOTE — Patient Instructions (Addendum)
Good to see you today! ?Please go to the lab for blood work and I will send results through [facility]. ?Referral to [facility]  ?

## 2022-03-10 NOTE — Progress Notes (Signed)
? ?Subjective:  ? ? Patient ID: Becky Jensen, female    DOB: May 19, 1971, 51 y.o.   MRN: [id number] ? ?Chief Complaint  ?Patient presents with  ? Follow-up  ? ? ?HPI ?Patient is in today for 6 month f/up. See A/P for details. No new changes to medical history since last visit.  ? ?Past Medical History:  ?Diagnosis Date  ? Depression   ? Hyperlipidemia   ? ? ?Past Surgical History:  ?Procedure Laterality Date  ? ANKLE FRACTURE SURGERY Right 08/2018  ? TONSILLECTOMY    ? ? ?Family History  ?Problem Relation Age of Onset  ? Hyperlipidemia Mother   ? Hypertension Father   ? Cancer Maternal Grandmother 71  ?     stomach  ? Heart disease Maternal Grandfather   ? Cancer Paternal Grandmother   ?     NH lymphoma  ? Hypertension Paternal Grandfather   ? Stroke Paternal Grandfather   ? Breast cancer Maternal Aunt   ? Breast cancer Cousin   ? ? ?Social History  ? ?Tobacco Use  ? Smoking status: Former  ?  Types: Cigarettes  ?  Quit date: 06/09/2009  ?  Years since quitting: 12.7  ? Smokeless tobacco: Never  ? Tobacco comments:  ?  sporadic  ?Vaping Use  ? Vaping Use: Never used  ?Substance Use Topics  ? Alcohol use: Yes  ?  Comment: 10 drinks per week  ? Drug use: No  ?  ? ?No Known Allergies ? ?Review of Systems ?NEGATIVE UNLESS OTHERWISE INDICATED IN HPI ? ? ?   ?Objective:  ?  ? ?Temp 99.4 ?F (37.4 ?C)   Ht 5' 2.5" (1.588 m)   Wt 204 lb (92.5 kg)   SpO2 98%   BMI 36.72 kg/m?  ? ?Wt Readings from Last 3 Encounters:  ?03/10/22 204 lb (92.5 kg)  ?07/08/21 195 lb 3.2 oz (88.5 kg)  ?07/01/21 198 lb 3.2 oz (89.9 kg)  ? ? ?BP Readings from Last 3 Encounters:  ?07/08/21 134/85  ?07/01/21 130/86  ?06/26/20 134/87  ?  ? ?Physical Exam ?Vitals and nursing note reviewed.  ?Constitutional:   ?   Appearance: Normal appearance. She is obese. She is not toxic-appearing.  ?HENT:  ?   Head: Normocephalic and atraumatic.  ?   Right Ear: External ear normal.  ?   Left Ear: External ear normal.  ?   Nose: Nose normal.  ?   Mouth/Throat:  ?    Mouth: Mucous membranes are moist.  ?Eyes:  ?   Extraocular Movements: Extraocular movements intact.  ?   Conjunctiva/sclera: Conjunctivae normal.  ?   Pupils: Pupils are equal, round, and reactive to light.  ?Cardiovascular:  ?   Rate and Rhythm: Normal rate and regular rhythm.  ?   Pulses: Normal pulses.  ?   Heart sounds: Normal heart sounds.  ?Pulmonary:  ?   Effort: Pulmonary effort is normal.  ?   Breath sounds: Normal breath sounds.  ?Musculoskeletal:     ?   General: Normal range of motion.  ?   Cervical back: Normal range of motion and neck supple.  ?Skin: ?   General: Skin is warm and dry.  ?Neurological:  ?   General: No focal deficit present.  ?   Mental Status: She is alert and oriented to person, place, and time.  ?Psychiatric:     ?   Mood and Affect: Mood normal.     ?   Behavior:  Behavior normal.     ?   Thought Content: Thought content normal.     ?   Judgment: Judgment normal.  ? ? ?   ?Assessment & Plan:  ? ?Problem List Items Addressed This Visit   ?None ?Visit Diagnoses   ? ? Dyslipidemia    -  Primary  ? Relevant Medications  ? atorvastatin (LIPITOR) 20 MG tablet  ? Other Relevant Orders  ? Lipid panel (Completed)  ? Amb Ref to [facility]  ? Diabetes mellitus screening      ? Relevant Orders  ? Comprehensive metabolic panel (Completed)  ? Hemoglobin A1c (Completed)  ? Screening for colon cancer      ? Relevant Orders  ? Cologuard  ? Vitamin D deficiency      ? Relevant Orders  ? VITAMIN D 25 Hydroxy (Vit-D Deficiency, Fractures) (Completed)  ? Obesity, Class II, BMI 35-39.9      ? Relevant Orders  ? Amb Ref to [facility]  ? ?  ? ? ? ?Meds ordered this encounter  ?Medications  ? ergocalciferol (VITAMIN D2) 1.25 MG (50000 UT) capsule  ?  Sig: Take 1 capsule (50,000 Units total) by mouth once a week.  ?  Dispense:  12 capsule  ?  Refill:  0  ? atorvastatin (LIPITOR) 20 MG tablet  ?  Sig: Take 1 tablet (20 mg total) by mouth daily.  ?  Dispense:  90 tablet  ?  Refill:   1  ? ? ?1. Dyslipidemia ?Update fasting lipid panel today ?Cont Lipitor 20 mg daily, adjust if needed ?Cont to work on lifestyle ? ?2. Diabetes mellitus screening ?Recheck CMP and HA1c ? ?3. Screening for colon cancer ?Not high risk, ok for Cologuard, order placed today ? ?4. Vitamin D deficiency ?Recheck Vit D level today ? ?5. Obesity, Class II, BMI 35-39.9 ?Pt did not f/up with [facility], but would like re-referral at this time as she thinks she would greatly benefit from their services, obliged today. ? ?6. Anxiety and depression ?Stable on Prozac 40 mg. Will call for refills. ? ? ? ?F/up 6 months or prn  ? ? ? ?Becky Oldaker M [name], PA-C ?

## 2022-03-15 ENCOUNTER — Other Ambulatory Visit: Payer: Self-pay

## 2022-03-15 MED ORDER — ATORVASTATIN CALCIUM 40 MG PO TABS: 40.0000 mg | ORAL_TABLET | Freq: Every day | ORAL | 1 refills | Status: DC

## 2022-03-31 ENCOUNTER — Other Ambulatory Visit: Payer: Self-pay | Admitting: Physician Assistant

## 2022-03-31 DIAGNOSIS — F32A Depression, unspecified: Secondary | ICD-10-CM

## 2022-06-13 ENCOUNTER — Encounter: Payer: Self-pay | Admitting: Physician Assistant

## 2022-06-13 NOTE — Telephone Encounter (Signed)
Called pt in regards to referral placed with [facility]; Becky Jensen no longer at that location and now with [facility]. Can provide contact info for that office if still wanting to see her particularly. LVM fo pt to return my call so we can get her referral sent to correct office and/or scheduled with [facility].

## 2022-06-14 ENCOUNTER — Encounter: Payer: Self-pay | Admitting: Physician Assistant

## 2022-06-14 NOTE — Telephone Encounter (Signed)
Anyone in particular you would like to recommend? Please advise

## 2022-06-17 ENCOUNTER — Other Ambulatory Visit: Payer: Self-pay | Admitting: Physician Assistant

## 2022-06-17 DIAGNOSIS — Z1231 Encounter for screening mammogram for malignant neoplasm of breast: Secondary | ICD-10-CM

## 2022-06-26 LAB — COLOGUARD: COLOGUARD: NEGATIVE

## 2022-07-05 ENCOUNTER — Ambulatory Visit
Admission: RE | Admit: 2022-07-05 | Discharge: 2022-07-05 | Disposition: A | Discharge status: Home / Self Care | Payer: Commercial Managed Care - PPO | Source: Ambulatory Visit | Attending: Physician Assistant | Admitting: Physician Assistant

## 2022-07-05 DIAGNOSIS — Z1231 Encounter for screening mammogram for malignant neoplasm of breast: Secondary | ICD-10-CM

## 2022-07-08 ENCOUNTER — Encounter: Payer: Commercial Managed Care - PPO | Admitting: Obstetrics and Gynecology

## 2022-07-27 ENCOUNTER — Encounter: Payer: Self-pay | Admitting: Obstetrics and Gynecology

## 2022-07-27 ENCOUNTER — Ambulatory Visit: Payer: Commercial Managed Care - PPO | Admitting: Obstetrics and Gynecology

## 2022-07-27 VITALS — BP 132/84 | HR 98 | Ht 62.0 in | Wt 202.0 lb

## 2022-07-27 DIAGNOSIS — Z8342 Family history of familial hypercholesterolemia: Secondary | ICD-10-CM | POA: Insufficient documentation

## 2022-07-27 DIAGNOSIS — Z01419 Encounter for gynecological examination (general) (routine) without abnormal findings: Secondary | ICD-10-CM

## 2022-07-27 DIAGNOSIS — R61 Generalized hyperhidrosis: Secondary | ICD-10-CM | POA: Diagnosis not present

## 2022-07-27 DIAGNOSIS — M810 Age-related osteoporosis without current pathological fracture: Secondary | ICD-10-CM | POA: Insufficient documentation

## 2022-07-27 DIAGNOSIS — N921 Excessive and frequent menstruation with irregular cycle: Secondary | ICD-10-CM

## 2022-07-27 DIAGNOSIS — F32A Depression, unspecified: Secondary | ICD-10-CM | POA: Insufficient documentation

## 2022-07-27 DIAGNOSIS — N951 Menopausal and female climacteric states: Secondary | ICD-10-CM | POA: Diagnosis not present

## 2022-07-27 DIAGNOSIS — Z780 Asymptomatic menopausal state: Secondary | ICD-10-CM | POA: Insufficient documentation

## 2022-07-27 DIAGNOSIS — F419 Anxiety disorder, unspecified: Secondary | ICD-10-CM | POA: Insufficient documentation

## 2022-07-27 NOTE — Progress Notes (Signed)
51 y.o. G19P0010 Married White or Caucasian Not Hispanic or Latino female here for annual exam.  She is having night sweats, and sleep disturbance.  Prior to May cycles were every 2-5 weeks. No cycle since 5/23. She typically bleeds for 3-7 days. Varied in flow, at most could saturate a super tampon in 6 hours.  She is waking up at least one time a night with sweats, sleep is disturbed, not sleeping well. Hot flashes vary from day to day, mostly tolerable. She is having mood changes.  Sexually active, no pain.  Period Pattern: (!) Irregular Menstrual Flow: Moderate Menstrual Control: Tampon, Maxi pad Menstrual Control Change Freq (Hours): 6-8 Dysmenorrhea: (!) Moderate Dysmenorrhea Symptoms: Cramping  Patient's last menstrual period was 05/10/2022 (approximate).          Sexually active: Yes.    The current method of family planning is none.    Exercising: Yes.    The patient does not participate in regular exercise at present. She does walk the dogs  Smoker:  no  Health Maintenance: Pap:  07/08/21 WNL;  08/03/2017 WNL Hr HPV Neg  History of abnormal Pap:  yes ASCUS, had a colposcopy. No surgery on her cervix.    MMG:  07/06/22 Density B Bi-rads 1neg  BMD:   none  Colonoscopy: No 06/20/22 cologuard negative TDaP:  07/16/18 Gardasil: none    reports that she quit smoking about 13 years ago. Her smoking use included cigarettes. She has never used smokeless tobacco. She reports current alcohol use. She reports that she does not use drugs. 10 glasses of wine or beer a week. 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 No children.  Past Medical History:  Diagnosis Date   Depression    Dysmenorrhea    Hyperlipidemia    Infertility, female    Skin Cancer     Past Surgical History:  Procedure Laterality Date   ANKLE FRACTURE SURGERY Right 08/2018   TONSILLECTOMY      Current Outpatient Medications  Medication Sig Dispense Refill   FLUoxetine (PROZAC) 40 MG capsule TAKE 1 CAPSULE(40 MG) BY  MOUTH DAILY 90 capsule 1   lisdexamfetamine (VYVANSE) 40 MG capsule Take 30 mg by mouth daily as needed.     Multiple Vitamin (MULTIVITAMIN WITH MINERALS) TABS tablet Take 1 tablet by mouth daily.     atorvastatin (LIPITOR) 40 MG tablet Take 1 tablet (40 mg total) by mouth daily. 90 tablet 1   No current facility-administered medications for this visit.    Family History  Problem Relation Age of Onset   Hyperlipidemia Mother    Hypertension Father    Breast cancer Maternal Aunt 76   Cancer Maternal Grandmother 80       stomach   Heart disease Maternal Grandfather    Cancer Paternal Grandmother        NH lymphoma   Hypertension Paternal Grandfather    Stroke Paternal Grandfather    Breast cancer Cousin   Dad with hydrocephalus   Review of Systems  Genitourinary:  Positive for menstrual problem.    Exam:   BP 132/84   Pulse 98   Ht '5\' 2"'$  (1.575 m)   Wt 202 lb (91.6 kg)   LMP 05/10/2022 (Approximate)   SpO2 99%   BMI 36.95 kg/m   Weight change: '@WEIGHTCHANGE'$ @ Height:   Height: '5\' 2"'$  (157.5 cm)  Ht Readings from Last 3 Encounters:  07/27/22 '5\' 2"'$  (1.575 m)  03/10/22 5' 2.5" (1.588 m)  07/08/21 5' 2.5" (1.588  m)    General appearance: alert, cooperative and appears stated age Head: Normocephalic, without obvious abnormality, atraumatic Neck: no adenopathy, supple, symmetrical, trachea midline and thyroid normal to inspection and palpation Lungs: clear to auscultation bilaterally Cardiovascular: regular rate and rhythm Breasts: normal appearance, no masses or tenderness Abdomen: soft, non-tender; non distended,  no masses,  no organomegaly Extremities: extremities normal, atraumatic, no cyanosis or edema Skin: Skin color, texture, turgor normal. No rashes or lesions Lymph nodes: Cervical, supraclavicular, and axillary nodes normal. No abnormal inguinal nodes palpated Neurologic: Grossly normal   Pelvic: External genitalia:  no lesions              Urethra:  normal  appearing urethra with no masses, tenderness or lesions              Bartholins and Skenes: normal                 Vagina: normal appearing vagina with normal color and discharge, no lesions              Cervix: no lesions               Bimanual Exam:  Uterus:   no masses or tenderness              Adnexa: no mass, fullness, tenderness               Rectovaginal: Confirms               Anus:  normal sphincter tone, no lesions  Gae Dry chaperoned for the exam.  1. Well woman exam Discussed breast self exam Discussed calcium and vit D intake No pap this year Labs with primary Mammogram and cologuard are UTD  2. Menometrorrhagia - US PELVIS TRANSVAGINAL NON-OB (TV ONLY); Future  3. Perimenopause  - Follicle stimulating hormone  4. Night sweats - Follicle stimulating hormone -We discussed HRT information given

## 2022-07-27 NOTE — Patient Instructions (Signed)
EXERCISE   We recommended that you start or continue a regular exercise program for good health. Physical activity is anything that gets your body moving, some is better than none. The CDC recommends 150 minutes per week of Moderate-Intensity Aerobic Activity and 2 or more days of Muscle Strengthening Activity.  Benefits of exercise are limitless: helps weight loss/weight maintenance, improves mood and energy, helps with depression and anxiety, improves sleep, tones and strengthens muscles, improves balance, improves bone density, protects from chronic conditions such as heart disease, high blood pressure and diabetes and so much more. To learn more visit: https://www.cdc.gov/physicalactivity/index.html  DIET: Good nutrition starts with a healthy diet of fruits, vegetables, whole grains, and lean protein sources. Drink plenty of water for hydration. Minimize empty calories, sodium, sweets. For more information about dietary recommendations visit: https://health.gov/our-work/nutrition-physical-activity/dietary-guidelines and https://www.myplate.gov/  ALCOHOL:  Women should limit their alcohol intake to no more than 7 drinks/beers/glasses of wine (combined, not each!) per week. Moderation of alcohol intake to this level decreases your risk of breast cancer and liver damage.  If you are concerned that you may have a problem, or your friends have told you they are concerned about your drinking, there are many resources to help. A well-known program that is free, effective, and available to all people all over the nation is Alcoholics Anonymous.  Check out this site to learn more: https://www.aa.org/   CALCIUM AND VITAMIN D:  Adequate intake of calcium and Vitamin D are recommended for bone health.  You should be getting between 1000-1200 mg of calcium and 800 units of Vitamin D daily between diet and supplements  PAP SMEARS:  Pap smears, to check for cervical cancer or precancers,  have traditionally been  done yearly, scientific advances have shown that most women can have pap smears less often.  However, every woman still should have a physical exam from her gynecologist every year. It will include a breast check, inspection of the vulva and vagina to check for abnormal growths or skin changes, a visual exam of the cervix, and then an exam to evaluate the size and shape of the uterus and ovaries. We will also provide age appropriate advice regarding health maintenance, like when you should have certain vaccines, screening for sexually transmitted diseases, bone density testing, colonoscopy, mammograms, etc.   MAMMOGRAMS:  All women over 40 years old should have a routine mammogram.   COLON CANCER SCREENING: Now recommend starting at age 45. At this time colonoscopy is not covered for routine screening until 50. There are take home tests that can be done between 45-49.   COLONOSCOPY:  Colonoscopy to screen for colon cancer is recommended for all women at age 50.  We know, you hate the idea of the prep.  We agree, BUT, having colon cancer and not knowing it is worse!!  Colon cancer so often starts as a polyp that can be seen and removed at colonscopy, which can quite literally save your life!  And if your first colonoscopy is normal and you have no family history of colon cancer, most women don't have to have it again for 10 years.  Once every ten years, you can do something that may end up saving your life, right?  We will be happy to help you get it scheduled when you are ready.  Be sure to check your insurance coverage so you understand how much it will cost.  It may be covered as a preventative service at no cost, but you should check   your particular policy.      Breast Self-Awareness Breast self-awareness means being familiar with how your breasts look and feel. It involves checking your breasts regularly and reporting any changes to your health care provider. Practicing breast self-awareness is  important. A change in your breasts can be a sign of a serious medical problem. Being familiar with how your breasts look and feel allows you to find any problems early, when treatment is more likely to be successful. All women should practice breast self-awareness, including women who have had breast implants. How to do a breast self-exam One Basilio to learn what is normal for your breasts and whether your breasts are changing is to do a breast self-exam. To do a breast self-exam: Look for Changes  Remove all the clothing above your waist. Stand in front of a mirror in a room with good lighting. Put your hands on your hips. Push your hands firmly downward. Compare your breasts in the mirror. Look for differences between them (asymmetry), such as: Differences in shape. Differences in size. Puckers, dips, and bumps in one breast and not the other. Look at each breast for changes in your skin, such as: Redness. Scaly areas. Look for changes in your nipples, such as: Discharge. Bleeding. Dimpling. Redness. A change in position. Feel for Changes Carefully feel your breasts for lumps and changes. It is best to do this while lying on your back on the floor and again while sitting or standing in the shower or tub with soapy water on your skin. Feel each breast in the following Egnor: Place the arm on the side of the breast you are examining above your head. Feel your breast with the other hand. Start in the nipple area and make  inch (2 cm) overlapping circles to feel your breast. Use the pads of your three middle fingers to do this. Apply light pressure, then medium pressure, then firm pressure. The light pressure will allow you to feel the tissue closest to the skin. The medium pressure will allow you to feel the tissue that is a little deeper. The firm pressure will allow you to feel the tissue close to the ribs. Continue the overlapping circles, moving downward over the breast until you feel your  ribs below your breast. Move one finger-width toward the center of the body. Continue to use the  inch (2 cm) overlapping circles to feel your breast as you move slowly up toward your collarbone. Continue the up and down exam using all three pressures until you reach your armpit.  Write Down What You Find  Write down what is normal for each breast and any changes that you find. Keep a written record with breast changes or normal findings for each breast. By writing this information down, you do not need to depend only on memory for size, tenderness, or location. Write down where you are in your menstrual cycle, if you are still menstruating. If you are having trouble noticing differences in your breasts, do not get discouraged. With time you will become more familiar with the variations in your breasts and more comfortable with the exam. How often should I examine my breasts? Examine your breasts every month. If you are breastfeeding, the best time to examine your breasts is after a feeding or after using a breast pump. If you menstruate, the best time to examine your breasts is 5-7 days after your period is over. During your period, your breasts are lumpier, and it may be more   difficult to notice changes. When should I see my health care provider? See your health care provider if you notice: A change in shape or size of your breasts or nipples. A change in the skin of your breast or nipples, such as a reddened or scaly area. Unusual discharge from your nipples. A lump or thick area that was not there before. Pain in your breasts. Anything that concerns you. Menopause and Hormone Replacement Therapy Menopause is a normal time of life when menstrual periods stop completely and the ovaries stop producing the female hormones estrogen and progesterone. Low levels of these hormones can affect your health and cause symptoms. Hormone replacement therapy (HRT) can relieve some of those symptoms. HRT is  the use of artificial (synthetic) hormones to replace hormones that your body has stopped producing because you have reached menopause. Types of HRT  HRT may consist of the synthetic hormones estrogen and progestin, or it may consist of estrogen-only therapy. You and your health care provider will decide which form of HRT is best for you. If you choose to be on HRT and you have a uterus, estrogen and progestin are usually prescribed. Estrogen-only therapy is used for women who do not have a uterus. Possible options for taking HRT include: Pills. Patches. Gels. Sprays. Vaginal cream. Vaginal rings. Vaginal inserts. The amount of hormones that you take and how long you take them varies according to your health. It is important to: Begin HRT with the lowest possible dosage. Stop HRT as soon as your health care provider tells you to stop. Work with your health care provider so that you feel informed and comfortable with your decisions. Tell a health care provider about: Any allergies you have. Whether you have had blood clots or know of any risk factors you may have for blood clots. Whether you or family members have had cancer, especially cancer of the breasts, ovaries, or uterus. Any surgeries you have had. All medicines you are taking, including vitamins, herbs, eye drops, creams, and over-the-counter medicines. Whether you are pregnant or may be pregnant. Any medical conditions you have. What are the benefits? HRT can reduce the frequency and severity of menopausal symptoms. Benefits of HRT vary according to the kind of symptoms that you have, how severe they are, and your overall health. HRT may help to improve the following symptoms of menopause: Hot flashes and night sweats. These are sudden feelings of heat that spread over the face and body. The skin may turn red, like a blush. Night sweats are hot flashes that happen while you are sleeping or trying to sleep. Bone loss  (osteoporosis). The body loses calcium more quickly after menopause, causing the bones to become weaker. This can increase the risk for bone breaks (fractures). Vaginal dryness. The lining of the vagina can become thin and dry, which can cause pain during sex or cause infection, burning, or itching. Urinary tract infections. Urinary incontinence. This is the inability to control when you urinate. Irritability. Short-term memory problems. What are the risks? Risks of HRT vary depending on your individual health and medical history. Risks of HRT also depend on whether you receive both estrogen and progestin or you receive estrogen only. HRT may increase the risk of: Spotting. This is when a small amount of blood leaks from the vagina unexpectedly. Endometrial cancer. This cancer is in the lining of the uterus (endometrium). Breast cancer. Increased density of breast tissue. This can make it harder to find breast cancer on a   breast X-ray (mammogram). Stroke. Heart disease. Blood clots. Gallbladder disease or liver disease. Risks of HRT can increase if you have any of the following conditions: Endometrial cancer. Liver disease. Heart disease. Breast cancer. History of blood clots. History of stroke. Follow these instructions at home: Pap tests Have Pap tests done as often as told by your health care provider. A Pap test is sometimes called a Pap smear. It is a screening test that is used to check for signs of cancer of the cervix and vagina. A Pap test can also identify the presence of infection or precancerous changes. Pap tests may be done: Every 3 years, starting at age 21. Every 5 years, starting after age 30, in combination with testing for human papillomavirus (HPV). More often or less often depending on other medical conditions you have, your age, and other risk factors. It is up to you to get the results of your Pap test. Ask your health care provider, or the department that is doing  the test, when your results will be ready. General instructions Take over-the-counter and prescription medicines only as told by your health care provider. Do not use any products that contain nicotine or tobacco. These products include cigarettes, chewing tobacco, and vaping devices, such as e-cigarettes. If you need help quitting, ask your health care provider. Get mammograms, pelvic exams, and medical checkups as often as told by your health care provider. Keep all follow-up visits. This is important. Contact a health care provider if you have: Pain or swelling in your legs. Lumps or changes in your breasts or armpits. Pain, burning, or bleeding when you urinate. Unusual vaginal bleeding. Dizziness or headaches. Pain in your abdomen. Get help right away if you have: Shortness of breath. Chest pain. Slurred speech. Weakness or numbness in any part of your arms or legs. These symptoms may represent a serious problem that is an emergency. Do not wait to see if the symptoms will go away. Get medical help right away. Call your local emergency services (911 in the [place] Do not drive yourself to the hospital. Summary Menopause is a normal time of life when menstrual periods stop completely and the ovaries stop producing the female hormones estrogen and progesterone. HRT can reduce the frequency and severity of menopausal symptoms. Risks of HRT vary depending on your individual health and medical history. This information is not intended to replace advice given to you by your health care provider. Make sure you discuss any questions you have with your health care provider. Document Revised: 06/15/2020 Document Reviewed: 06/15/2020 Elsevier Patient Education  2023 Elsevier Inc.  

## 2022-07-28 LAB — FOLLICLE STIMULATING HORMONE: FSH: 60.8 m[IU]/mL

## 2022-08-03 ENCOUNTER — Encounter (INDEPENDENT_AMBULATORY_CARE_PROVIDER_SITE_OTHER): Payer: Self-pay

## 2022-08-04 ENCOUNTER — Encounter: Payer: Self-pay | Admitting: Physician Assistant

## 2022-08-11 ENCOUNTER — Ambulatory Visit (INDEPENDENT_AMBULATORY_CARE_PROVIDER_SITE_OTHER): Payer: Commercial Managed Care - PPO

## 2022-08-11 DIAGNOSIS — N921 Excessive and frequent menstruation with irregular cycle: Secondary | ICD-10-CM | POA: Diagnosis not present

## 2022-08-13 ENCOUNTER — Telehealth: Payer: Self-pay | Admitting: Obstetrics and Gynecology

## 2022-08-13 NOTE — Telephone Encounter (Signed)
Please let the patient know that I have reviewed her ultrasound. She has 2 small fibroids (benign tumors of the uterus), not worrisome. The rest of her ultrasound is normal. There are no obvious abnormalities in her endometrium, but given her irregular bleeding it is recommended that we sample the lining of the endometrium (endometrial biopsy). She should take ibuprofen prior to her visit. Please review this with her. I believe she has an appointment this week.

## 2022-08-15 NOTE — Telephone Encounter (Signed)
Called patient and per DPR access note on file I left detailed message in voice mail because our phones are off for the day and I did not want her to worry. I asked her to call me in the morning/tomorrow so we can discuss and schedule.

## 2022-08-17 ENCOUNTER — Ambulatory Visit: Payer: Commercial Managed Care - PPO | Admitting: Obstetrics and Gynecology

## 2022-08-17 ENCOUNTER — Other Ambulatory Visit (HOSPITAL_COMMUNITY)
Admission: RE | Admit: 2022-08-17 | Discharge: 2022-08-17 | Disposition: A | Discharge status: Home / Self Care | Payer: Commercial Managed Care - PPO | Source: Ambulatory Visit | Attending: Obstetrics and Gynecology | Admitting: Obstetrics and Gynecology

## 2022-08-17 ENCOUNTER — Encounter: Payer: Self-pay | Admitting: Obstetrics and Gynecology

## 2022-08-17 VITALS — BP 122/82 | HR 76 | Wt 202.0 lb

## 2022-08-17 DIAGNOSIS — R61 Generalized hyperhidrosis: Secondary | ICD-10-CM | POA: Diagnosis not present

## 2022-08-17 DIAGNOSIS — N921 Excessive and frequent menstruation with irregular cycle: Secondary | ICD-10-CM

## 2022-08-17 DIAGNOSIS — N951 Menopausal and female climacteric states: Secondary | ICD-10-CM

## 2022-08-17 LAB — PREGNANCY, URINE: Preg Test, Ur: NEGATIVE

## 2022-08-17 MED ORDER — ESTRADIOL 0.025 MG/24HR TD PTTW: 1.0000 | MEDICATED_PATCH | TRANSDERMAL | 12 refills | Status: DC

## 2022-08-17 MED ORDER — PROGESTERONE 200 MG PO CAPS: 200.0000 mg | ORAL_CAPSULE | Freq: Every day | ORAL | 0 refills | Status: DC

## 2022-08-17 NOTE — Progress Notes (Signed)
GYNECOLOGY  VISIT   HPI: 51 y.o.   Married White or Caucasian Not Hispanic or Latino  female   [id number] with Patient's last menstrual period was 05/10/2022 (approximate).   here for f/u of AUB. She was seen earlier this month for an annual exam, she c/o vasomotor symptoms and irregular cycles (last cycle in 5/23). FSH was 60.8.  U/S  Impression:  Normal sized anteverted uterus 2 small intramural myomas noted, not deviating the endometrium Normal ovaries bilaterally Endometrium was 4.59 cm, avascular.  GYNECOLOGIC HISTORY: Patient's last menstrual period was 05/10/2022 (approximate). Contraception:none  Menopausal hormone therapy: none         OB History     Gravida  1   Para  0   Term  0   Preterm  0   AB  1   Living  0      SAB  1   IAB  0   Ectopic  0   Multiple  0   Live Births                 Patient Active Problem List   Diagnosis Date Noted   Depression 07/27/2022   Family history of high cholesterol 07/27/2022   Osteoporosis 07/27/2022   Perimenopause 07/27/2022   Bimalleolar ankle fracture, right, closed, initial encounter 09/05/2018   Infertility, female 03/20/2015   Attention deficit disorder of adult 12/26/1978    Past Medical History:  Diagnosis Date   Depression    Dysmenorrhea    Hyperlipidemia    Infertility, female    Skin Cancer     Past Surgical History:  Procedure Laterality Date   ANKLE FRACTURE SURGERY Right 08/2018   TONSILLECTOMY      Current Outpatient Medications  Medication Sig Dispense Refill   atorvastatin (LIPITOR) 40 MG tablet Take 1 tablet (40 mg total) by mouth daily. 90 tablet 1   FLUoxetine (PROZAC) 40 MG capsule TAKE 1 CAPSULE(40 MG) BY MOUTH DAILY 90 capsule 1   lisdexamfetamine (VYVANSE) 40 MG capsule Take 30 mg by mouth daily as needed.     Multiple Vitamin (MULTIVITAMIN WITH MINERALS) TABS tablet Take 1 tablet by mouth daily.     No current facility-administered medications for this visit.      ALLERGIES: Patient has no known allergies.  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 Moved to [place] in 2004. Lives at home with husband.    Social Determinants of Health   Financial Resource Strain: Low Risk  (06/10/2019)   Overall Financial Resource Strain (CARDIA)    Difficulty of Paying Living Expenses: Not hard at all  Food Insecurity: No Food Insecurity (06/10/2019)   Hunger Vital Sign    Worried About Running Out of Food in the Last Year: Never true    Ran Out of Food in the Last Year: Never true  Transportation Needs: No Transportation Needs (06/10/2019)   PRAPARE - Hydrologist (Medical): No    Lack of Transportation (Non-Medical): No  Physical Activity: Sufficiently Active (07/16/2018)   Exercise Vital Sign    Days of Exercise per Week: 5 days    Minutes of Exercise per Session: 30 min   Stress: No Stress Concern Present (06/10/2019)   [place]    Feeling of Stress : Not at all  Social Connections: Somewhat Isolated (07/16/2018)   Social Connection and Isolation Panel [NHANES]    Frequency of Communication with Friends and Family: More than three times a week    Frequency of Social Gatherings with Friends and Family: Once a week    Attends Religious Services: Never    Marine scientist or Organizations: No    Attends Archivist Meetings: Never    Marital Status: Married  Human resources officer Violence: Not At Risk (07/16/2018)   Humiliation, Afraid, Rape, and Kick questionnaire    Fear of Current or Ex-Partner: No    Emotionally Abused: No    Physically Abused: No    Sexually Abused: No    ROS  PHYSICAL EXAMINATION:    BP 122/82   Pulse 76   Wt 202 lb (91.6 kg)   LMP 05/10/2022 (Approximate)   SpO2 99%   BMI 36.95 kg/m     General appearance: alert, cooperative and appears stated age  Pelvic: External genitalia:  no lesions              Urethra:  normal appearing urethra with no masses, tenderness or lesions              Bartholins and Skenes: normal                 Vagina: normal appearing vagina with normal color and discharge, no lesions              Cervix: no lesions              Chaperone was present for exam.  1. Night sweats Desires HRT, no contraindications, risks reviewed - estradiol (VIVELLE-DOT) 0.025 MG/24HR; Place 1 patch onto the skin 2 (two) times a week.  Dispense: 8 patch; Refill: 12 - progesterone (PROMETRIUM) 200 MG capsule; Take 1 capsule (200 mg total) by mouth daily. Take days #1-12 of the month at hs.  Dispense: 36 capsule; Refill: 0 -f/u in one month, if she is doing great she will just call.  2. Perimenopause - estradiol (VIVELLE-DOT) 0.025 MG/24HR; Place 1 patch onto the skin 2 (two) times a week.  Dispense: 8 patch; Refill: 12 - progesterone  (PROMETRIUM) 200 MG capsule; Take 1 capsule (200 mg total)  by mouth daily. Take days #1-12 of the month at hs.  Dispense: 36 capsule; Refill: 0  3. Menometrorrhagia - Pregnancy, urine - Surgical pathology( [place])

## 2022-08-17 NOTE — Patient Instructions (Addendum)
Endometrial Biopsy Post-procedure Instructions Cramping is common.  You may take Ibuprofen, Aleve, or Tylenol for the cramping.  This should resolve within 24 hours.   You may have a small amount of spotting.  You should wear a mini pad for the next few days. You may have intercourse in 24 hours. You need to call the office if you have any pelvic pain, fever, heavy bleeding, or foul smelling vaginal discharge. Shower or bathe as normal You will be notified within one week of your biopsy results or we will discuss your results at your follow-up appointment if needed.   Menopause and Hormone Replacement Therapy Menopause is a normal time of life when menstrual periods stop completely and the ovaries stop producing the female hormones estrogen and progesterone. Low levels of these hormones can affect your health and cause symptoms. Hormone replacement therapy (HRT) can relieve some of those symptoms. HRT is the use of artificial (synthetic) hormones to replace hormones that your body has stopped producing because you have reached menopause. Types of HRT  HRT may consist of the synthetic hormones estrogen and progestin, or it may consist of estrogen-only therapy. You and your health care provider will decide which form of HRT is best for you. If you choose to be on HRT and you have a uterus, estrogen and progestin are usually prescribed. Estrogen-only therapy is used for women who do not have a uterus. Possible options for taking HRT include: Pills. Patches. Gels. Sprays. Vaginal cream. Vaginal rings. Vaginal inserts. The amount of hormones that you take and how long you take them varies according to your health. It is important to: Begin HRT with the lowest possible dosage. Stop HRT as soon as your health care provider tells you to stop. Work with your health care provider so that you feel informed and comfortable with your decisions. Tell a health care provider about: Any allergies you  have. Whether you have had blood clots or know of any risk factors you may have for blood clots. Whether you or family members have had cancer, especially cancer of the breasts, ovaries, or uterus. Any surgeries you have had. All medicines you are taking, including vitamins, herbs, eye drops, creams, and over-the-counter medicines. Whether you are pregnant or may be pregnant. Any medical conditions you have. What are the benefits? HRT can reduce the frequency and severity of menopausal symptoms. Benefits of HRT vary according to the kind of symptoms that you have, how severe they are, and your overall health. HRT may help to improve the following symptoms of menopause: Hot flashes and night sweats. These are sudden feelings of heat that spread over the face and body. The skin may turn red, like a blush. Night sweats are hot flashes that happen while you are sleeping or trying to sleep. Bone loss (osteoporosis). The body loses calcium more quickly after menopause, causing the bones to become weaker. This can increase the risk for bone breaks (fractures). Vaginal dryness. The lining of the vagina can become thin and dry, which can cause pain during sex or cause infection, burning, or itching. Urinary tract infections. Urinary incontinence. This is the inability to control when you urinate. Irritability. Short-term memory problems. What are the risks? Risks of HRT vary depending on your individual health and medical history. Risks of HRT also depend on whether you receive both estrogen and progestin or you receive estrogen only. HRT may increase the risk of: Spotting. This is when a small amount of blood leaks from the  vagina unexpectedly. Endometrial cancer. This cancer is in the lining of the uterus (endometrium). Breast cancer. Increased density of breast tissue. This can make it harder to find breast cancer on a breast X-ray (mammogram). Stroke. Heart disease. Blood clots. Gallbladder  disease or liver disease. Risks of HRT can increase if you have any of the following conditions: Endometrial cancer. Liver disease. Heart disease. Breast cancer. History of blood clots. History of stroke. Follow these instructions at home: Pap tests Have Pap tests done as often as told by your health care provider. A Pap test is sometimes called a Pap smear. It is a screening test that is used to check for signs of cancer of the cervix and vagina. A Pap test can also identify the presence of infection or precancerous changes. Pap tests may be done: Every 3 years, starting at age 35. Every 5 years, starting after age 64, in combination with testing for human papillomavirus (HPV). More often or less often depending on other medical conditions you have, your age, and other risk factors. It is up to you to get the results of your Pap test. Ask your health care provider, or the department that is doing the test, when your results will be ready. General instructions Take over-the-counter and prescription medicines only as told by your health care provider. Do not use any products that contain nicotine or tobacco. These products include cigarettes, chewing tobacco, and vaping devices, such as e-cigarettes. If you need help quitting, ask your health care provider. Get mammograms, pelvic exams, and medical checkups as often as told by your health care provider. Keep all follow-up visits. This is important. Contact a health care provider if you have: Pain or swelling in your legs. Lumps or changes in your breasts or armpits. Pain, burning, or bleeding when you urinate. Unusual vaginal bleeding. Dizziness or headaches. Pain in your abdomen. Get help right away if you have: Shortness of breath. Chest pain. Slurred speech. Weakness or numbness in any part of your arms or legs. These symptoms may represent a serious problem that is an emergency. Do not wait to see if the symptoms will go away. Get  medical help right away. Call your local emergency services (911 in the [place] Do not drive yourself to the hospital. Summary Menopause is a normal time of life when menstrual periods stop completely and the ovaries stop producing the female hormones estrogen and progesterone. HRT can reduce the frequency and severity of menopausal symptoms. Risks of HRT vary depending on your individual health and medical history. This information is not intended to replace advice given to you by your health care provider. Make sure you discuss any questions you have with your health care provider. Document Revised: 06/15/2020 Document Reviewed: 06/15/2020 Elsevier Patient Education  [place].

## 2022-08-19 LAB — SURGICAL PATHOLOGY

## 2022-08-22 NOTE — Telephone Encounter (Signed)
Patient did not call back but did have OV with endo biopsy on 8.23.23.

## 2022-09-12 ENCOUNTER — Other Ambulatory Visit: Payer: Self-pay

## 2022-09-12 ENCOUNTER — Telehealth: Payer: Self-pay | Admitting: Physician Assistant

## 2022-09-12 ENCOUNTER — Ambulatory Visit: Payer: Commercial Managed Care - PPO | Admitting: Physician Assistant

## 2022-09-12 DIAGNOSIS — R5383 Other fatigue: Secondary | ICD-10-CM

## 2022-09-12 DIAGNOSIS — E559 Vitamin D deficiency, unspecified: Secondary | ICD-10-CM

## 2022-09-12 DIAGNOSIS — E785 Hyperlipidemia, unspecified: Secondary | ICD-10-CM

## 2022-09-12 NOTE — Telephone Encounter (Signed)
Pt states: -upcoming 09/22 appt is to follow up and check blood work.  -she was instructed to have blood drawn before the appointment.    No active orders found for labs. Please advise.   Route to [facility] Admin pool for follow up scheduling.

## 2022-09-12 NOTE — Telephone Encounter (Signed)
Future orders were placed and called pt and lvm to contact office to schedule a lab appt prior to 9/22 appt if she chooses to do so.

## 2022-09-15 ENCOUNTER — Other Ambulatory Visit (INDEPENDENT_AMBULATORY_CARE_PROVIDER_SITE_OTHER): Payer: Commercial Managed Care - PPO

## 2022-09-15 DIAGNOSIS — R5383 Other fatigue: Secondary | ICD-10-CM

## 2022-09-15 DIAGNOSIS — E785 Hyperlipidemia, unspecified: Secondary | ICD-10-CM | POA: Diagnosis not present

## 2022-09-15 DIAGNOSIS — E559 Vitamin D deficiency, unspecified: Secondary | ICD-10-CM | POA: Diagnosis not present

## 2022-09-15 LAB — COMPREHENSIVE METABOLIC PANEL
ALT: 16 U/L (ref 0–35)
AST: 21 U/L (ref 0–37)
Albumin: 4 g/dL (ref 3.5–5.2)
Alkaline Phosphatase: 93 U/L (ref 39–117)
BUN: 13 mg/dL (ref 6–23)
CO2: 26 mEq/L (ref 19–32)
Calcium: 9.2 mg/dL (ref 8.4–10.5)
Chloride: 99 mEq/L (ref 96–112)
Creatinine, Ser: 0.64 mg/dL (ref 0.40–1.20)
GFR: 102.61 mL/min (ref >60.00)
Glucose, Bld: 95 mg/dL (ref 70–99)
Potassium: 4.5 mEq/L (ref 3.5–5.1)
Sodium: 134 mEq/L — ABNORMAL LOW (ref 135–145)
Total Bilirubin: 0.2 mg/dL (ref 0.2–1.2)
Total Protein: 7.2 g/dL (ref 6.0–8.3)

## 2022-09-15 LAB — LIPID PANEL
Cholesterol: 201 mg/dL — ABNORMAL HIGH (ref 0–200)
HDL: 91.7 mg/dL (ref >39.00)
LDL Cholesterol: 93 mg/dL (ref 0–99)
NonHDL: 109.48
Total CHOL/HDL Ratio: 2
Triglycerides: 80 mg/dL (ref 0.0–149.0)
VLDL: 16 mg/dL (ref 0.0–40.0)

## 2022-09-16 ENCOUNTER — Ambulatory Visit: Payer: Commercial Managed Care - PPO | Admitting: Physician Assistant

## 2022-09-16 VITALS — BP 162/102 | HR 102 | Temp 98.6°F | Ht 62.0 in | Wt 200.4 lb

## 2022-09-16 DIAGNOSIS — Z23 Encounter for immunization: Secondary | ICD-10-CM

## 2022-09-16 DIAGNOSIS — F32A Depression, unspecified: Secondary | ICD-10-CM

## 2022-09-16 DIAGNOSIS — F419 Anxiety disorder, unspecified: Secondary | ICD-10-CM | POA: Diagnosis not present

## 2022-09-16 DIAGNOSIS — R030 Elevated blood-pressure reading, without diagnosis of hypertension: Secondary | ICD-10-CM

## 2022-09-16 DIAGNOSIS — E785 Hyperlipidemia, unspecified: Secondary | ICD-10-CM | POA: Diagnosis not present

## 2022-09-16 DIAGNOSIS — E66812 Obesity, class 2: Secondary | ICD-10-CM

## 2022-09-16 DIAGNOSIS — E559 Vitamin D deficiency, unspecified: Secondary | ICD-10-CM

## 2022-09-16 DIAGNOSIS — E669 Obesity, unspecified: Secondary | ICD-10-CM | POA: Diagnosis not present

## 2022-09-16 MED ORDER — ATORVASTATIN CALCIUM 40 MG PO TABS: 40.0000 mg | ORAL_TABLET | Freq: Every day | ORAL | 1 refills | Status: DC

## 2022-09-16 MED ORDER — FLUOXETINE HCL 40 MG PO CAPS: ORAL_CAPSULE | ORAL | 1 refills | Status: DC

## 2022-09-16 NOTE — Progress Notes (Unsigned)
Subjective:    Patient ID: Becky Jensen, female    DOB: [birth year], 51 y.o.   MRN: [id number]  Chief Complaint  Patient presents with   Follow-up    Pt in to discuss weight and labs, Prozac and Lipitor have expired needs new scripts;     HPI Patient is in today for 56-monthfollow-up.  No major health changes since last visit.  She is working with Dr. JTalbert Nanon perimenopausal symptoms.    Past Medical History:  Diagnosis Date   Depression    Dysmenorrhea    Hyperlipidemia    Infertility, female    Skin Cancer     Past Surgical History:  Procedure Laterality Date   ANKLE FRACTURE SURGERY Right 08/2018   TONSILLECTOMY      Family History  Problem Relation Age of Onset   Hyperlipidemia Mother    Hypertension Father    Breast cancer Maternal Aunt 447  Cancer Maternal Grandmother 80       stomach   Heart disease Maternal Grandfather    Cancer Paternal Grandmother        NH lymphoma   Hypertension Paternal Grandfather    Stroke Paternal Grandfather    Breast cancer Cousin     Social History   Tobacco Use   Smoking status: Former    Types: Cigarettes    Quit date: 06/09/2009    Years since quitting: 13.2   Smokeless tobacco: Never   Tobacco comments:    sporadic  Vaping Use   Vaping Use: Never used  Substance Use Topics   Alcohol use: Yes    Comment: 10 drinks per week   Drug use: No     No Known Allergies  Review of Systems NEGATIVE UNLESS OTHERWISE INDICATED IN HPI      Objective:     BP (!) 162/102 (BP Location: Left Arm)   Pulse (!) 102   Temp 98.6 F (37 C) (Temporal)   Ht '5\' 2"'$  (1.575 m)   Wt 200 lb 6.4 oz (90.9 kg)   LMP 09/05/2022 (Exact Date)   SpO2 99%   BMI 36.65 kg/m   Wt Readings from Last 3 Encounters:  09/16/22 200 lb 6.4 oz (90.9 kg)  08/17/22 202 lb (91.6 kg)  07/27/22 202 lb (91.6 kg)    BP Readings from Last 3 Encounters:  09/16/22 (!) 162/102  08/17/22 122/82  07/27/22 132/84     Physical Exam Vitals and  nursing note reviewed.  Constitutional:      Appearance: Normal appearance. She is obese. She is not toxic-appearing.  HENT:     Head: Normocephalic and atraumatic.  Eyes:     Extraocular Movements: Extraocular movements intact.     Conjunctiva/sclera: Conjunctivae normal.     Pupils: Pupils are equal, round, and reactive to light.  Cardiovascular:     Rate and Rhythm: Normal rate and regular rhythm.     Pulses: Normal pulses.     Heart sounds: Normal heart sounds.  Pulmonary:     Effort: Pulmonary effort is normal.     Breath sounds: Normal breath sounds.  Musculoskeletal:        General: Normal range of motion.     Cervical back: Normal range of motion and neck supple.     Right lower leg: No edema.     Left lower leg: No edema.  Skin:    General: Skin is warm and dry.  Neurological:     General: No focal deficit  present.     Mental Status: She is alert and oriented to person, place, and time.  Psychiatric:        Mood and Affect: Mood normal.        Behavior: Behavior normal.        Assessment & Plan:  Anxiety and depression  Dyslipidemia  Obesity, Class II, BMI 35-39.9  Vitamin D deficiency  Need for prophylactic vaccination and inoculation against varicella -     Varicella-zoster vaccine IM  Need for immunization against influenza -     Flu Vaccine QUAD 14moIM (Fluarix, Fluzone & Alfiuria Quad PF)  Other orders -     Atorvastatin Calcium; Take 1 tablet (40 mg total) by mouth daily.  Dispense: 90 tablet; Refill: 1    Patient's blood pressure was noted to be elevated in office.  She is going to keep an eye on this and track at home.  She is going to call in MyChart with updates.  Need to consider intervention if continuing to trend 140s over 90s or higher.   Return in about 6 months (around 03/17/2023) for recheck / fasting labs.  This note was prepared with assistance of [employer] analyst Occasional wrong-word or sound-a-like substitutions  may have occurred due to the inherent limitations of voice recognition software.     Cyan Clippinger M Miracle Mongillo, PA-C

## 2022-09-19 ENCOUNTER — Encounter: Payer: Self-pay | Admitting: Physician Assistant

## 2022-09-19 ENCOUNTER — Encounter: Payer: Self-pay | Admitting: Obstetrics and Gynecology

## 2022-09-19 LAB — VITAMIN D 25 HYDROXY (VIT D DEFICIENCY, FRACTURES): VITD: 30.61 ng/mL (ref 30.00–100.00)

## 2022-09-20 ENCOUNTER — Other Ambulatory Visit: Payer: Self-pay

## 2022-09-20 DIAGNOSIS — F419 Anxiety disorder, unspecified: Secondary | ICD-10-CM

## 2022-09-20 MED ORDER — FLUOXETINE HCL 40 MG PO CAPS: ORAL_CAPSULE | ORAL | 1 refills | Status: DC

## 2022-09-20 NOTE — Telephone Encounter (Signed)
Dr. Talbert Nan, I will help her figure out the estrogen patch. Do you want to place refills on her progesterone? She was prescribed #36 with no refills.

## 2022-09-21 ENCOUNTER — Other Ambulatory Visit: Payer: Self-pay | Admitting: Obstetrics and Gynecology

## 2022-09-21 DIAGNOSIS — R61 Generalized hyperhidrosis: Secondary | ICD-10-CM

## 2022-09-21 DIAGNOSIS — N951 Menopausal and female climacteric states: Secondary | ICD-10-CM

## 2022-09-21 MED ORDER — ESTRADIOL 0.025 MG/24HR TD PTTW: 1.0000 | MEDICATED_PATCH | TRANSDERMAL | 3 refills | Status: DC

## 2022-09-21 MED ORDER — PROGESTERONE 200 MG PO CAPS: 200.0000 mg | ORAL_CAPSULE | Freq: Every day | ORAL | 3 refills | Status: DC

## 2022-09-22 NOTE — Assessment & Plan Note (Signed)
Advise daily supplementation of vitamin D 2000 to 4000 IU daily.

## 2022-09-22 NOTE — Assessment & Plan Note (Signed)
Doing well with Lipitor 40 mg every night.  Refilled this medication today.

## 2022-09-22 NOTE — Assessment & Plan Note (Signed)
Patient is currently stable on Prozac 40 mg once daily.  She will call for refills.

## 2022-09-22 NOTE — Assessment & Plan Note (Signed)
Encourage patient to work on lifestyle changes as she has been.

## 2022-12-26 DIAGNOSIS — Z800 Family history of malignant neoplasm of digestive organs: Secondary | ICD-10-CM | POA: Insufficient documentation

## 2022-12-26 DIAGNOSIS — Z820 Family history of epilepsy and other diseases of the nervous system: Secondary | ICD-10-CM | POA: Insufficient documentation

## 2023-01-30 ENCOUNTER — Telehealth: Payer: Commercial Managed Care - PPO | Admitting: Nurse Practitioner

## 2023-01-30 DIAGNOSIS — J40 Bronchitis, not specified as acute or chronic: Secondary | ICD-10-CM | POA: Diagnosis not present

## 2023-01-30 DIAGNOSIS — J0110 Acute frontal sinusitis, unspecified: Secondary | ICD-10-CM | POA: Diagnosis not present

## 2023-01-30 MED ORDER — DOXYCYCLINE HYCLATE 100 MG PO TABS: 100.0000 mg | ORAL_TABLET | Freq: Two times a day (BID) | ORAL | 0 refills | Status: AC

## 2023-01-30 MED ORDER — BENZONATATE 100 MG PO CAPS: 100.0000 mg | ORAL_CAPSULE | Freq: Three times a day (TID) | ORAL | 0 refills | Status: DC | PRN

## 2023-01-30 NOTE — Progress Notes (Signed)
We are sorry that you are not feeling well.  Here is how we plan to help!  Based on your presentation I believe you most likely have A cough due to bacteria.  When patients have a fever and a productive cough with a change in color or increased sputum production, we are concerned about bacterial bronchitis.  If left untreated it can progress to pneumonia.  If your symptoms do not improve with your treatment plan it is important that you contact your provider.   I have prescribed Doxycycline 100 mg twice a day for 10 days, this antibiotic will also cover your sinuses.     In addition you may use A prescription cough medication called Tessalon Perles '100mg'$ . You may take 1-2 capsules every 8 hours as needed for your cough.   From your responses in the eVisit questionnaire you describe inflammation in the upper respiratory tract which is causing a significant cough.  This is commonly called Bronchitis and has four common causes:   Allergies Viral Infections Acid Reflux Bacterial Infection Allergies, viruses and acid reflux are treated by controlling symptoms or eliminating the cause. An example might be a cough caused by taking certain blood pressure medications. You stop the cough by changing the medication. Another example might be a cough caused by acid reflux. Controlling the reflux helps control the cough.     HOME CARE Only take medications as instructed by your medical team. Complete the entire course of an antibiotic. Drink plenty of fluids and get plenty of rest. Avoid close contacts especially the very young and the elderly Cover your mouth if you cough or cough into your sleeve. Always remember to wash your hands A steam or ultrasonic humidifier can help congestion.   GET HELP RIGHT AWAY IF: You develop worsening fever. You become short of breath You cough up blood. Your symptoms persist after you have completed your treatment plan MAKE SURE YOU  Understand these  instructions. Will watch your condition. Will get help right away if you are not doing well or get worse.    Thank you for choosing an e-visit.  Your e-visit answers were reviewed by a board certified advanced clinical practitioner to complete your personal care plan. Depending upon the condition, your plan could have included both over the counter or prescription medications.  Please review your pharmacy choice. Make sure the pharmacy is open so you can pick up prescription now. If there is a problem, you may contact your provider through [employer] and have the prescription routed to another pharmacy.  Your safety is important to [place]. If you have drug allergies check your prescription carefully.   For the next 24 hours you can use MyChart to ask questions about today's visit, request a non-urgent call back, or ask for a work or school excuse. You will get an email in the next two days asking about your experience. I hope that your e-visit has been valuable and will speed your recovery.   Meds ordered this encounter  Medications   doxycycline (VIBRA-TABS) 100 MG tablet    Sig: Take 1 tablet (100 mg total) by mouth 2 (two) times daily for 10 days.    Dispense:  20 tablet    Refill:  0   benzonatate (TESSALON) 100 MG capsule    Sig: Take 1 capsule (100 mg total) by mouth 3 (three) times daily as needed.    Dispense:  30 capsule    Refill:  0    I  spent approximately 5 minutes reviewing the patient's history, current symptoms and coordinating their care today.

## 2023-05-09 ENCOUNTER — Other Ambulatory Visit: Payer: Self-pay | Admitting: Physician Assistant

## 2023-05-09 ENCOUNTER — Encounter: Payer: Self-pay | Admitting: Physician Assistant

## 2023-05-09 MED ORDER — ATORVASTATIN CALCIUM 40 MG PO TABS: 40.0000 mg | ORAL_TABLET | Freq: Every day | ORAL | 0 refills | Status: DC

## 2023-06-20 ENCOUNTER — Other Ambulatory Visit: Payer: Self-pay | Admitting: Physician Assistant

## 2023-06-20 DIAGNOSIS — Z1231 Encounter for screening mammogram for malignant neoplasm of breast: Secondary | ICD-10-CM

## 2023-06-20 NOTE — Telephone Encounter (Signed)
Sent pt an my chart message to call the office to make an appt.

## 2023-06-21 ENCOUNTER — Telehealth: Payer: Self-pay | Admitting: Physician Assistant

## 2023-06-21 DIAGNOSIS — R5383 Other fatigue: Secondary | ICD-10-CM

## 2023-06-21 DIAGNOSIS — E559 Vitamin D deficiency, unspecified: Secondary | ICD-10-CM

## 2023-06-21 DIAGNOSIS — E669 Obesity, unspecified: Secondary | ICD-10-CM

## 2023-06-21 DIAGNOSIS — E66812 Obesity, class 2: Secondary | ICD-10-CM

## 2023-06-21 DIAGNOSIS — E785 Hyperlipidemia, unspecified: Secondary | ICD-10-CM

## 2023-06-21 NOTE — Telephone Encounter (Signed)
Pt is scheduled for CPE on 07/26/23 & would like to do labs prior. Please advise

## 2023-06-21 NOTE — Telephone Encounter (Signed)
Please call pt and schedule lab appointment only one week prior to CPE , tell pt to fast for labs.

## 2023-06-21 NOTE — Telephone Encounter (Signed)
Pt called in regards to making a CPE. I informed pt of CPE scheduled this morning for 7/31 @ 10:30am. Also got pt scheduled for labs 7/24 @ 9:30. Pt was informed this is to be done while fasting. Pt verbalized understanding.

## 2023-07-18 ENCOUNTER — Ambulatory Visit: Admission: RE | Admit: 2023-07-18 | Payer: Commercial Managed Care - PPO | Source: Ambulatory Visit

## 2023-07-18 DIAGNOSIS — Z1231 Encounter for screening mammogram for malignant neoplasm of breast: Secondary | ICD-10-CM

## 2023-07-19 ENCOUNTER — Other Ambulatory Visit (INDEPENDENT_AMBULATORY_CARE_PROVIDER_SITE_OTHER): Payer: Commercial Managed Care - PPO

## 2023-07-19 DIAGNOSIS — E785 Hyperlipidemia, unspecified: Secondary | ICD-10-CM

## 2023-07-19 DIAGNOSIS — R5383 Other fatigue: Secondary | ICD-10-CM | POA: Diagnosis not present

## 2023-07-19 DIAGNOSIS — E669 Obesity, unspecified: Secondary | ICD-10-CM

## 2023-07-19 DIAGNOSIS — E559 Vitamin D deficiency, unspecified: Secondary | ICD-10-CM

## 2023-07-19 DIAGNOSIS — E66812 Obesity, class 2: Secondary | ICD-10-CM

## 2023-07-19 LAB — LIPID PANEL
Cholesterol: 222 mg/dL — ABNORMAL HIGH (ref 0–200)
HDL: 87.8 mg/dL (ref >39.00)
LDL Cholesterol: 114 mg/dL — ABNORMAL HIGH (ref 0–99)
NonHDL: 134.26
Total CHOL/HDL Ratio: 3
Triglycerides: 101 mg/dL (ref 0.0–149.0)
VLDL: 20.2 mg/dL (ref 0.0–40.0)

## 2023-07-19 LAB — CBC WITH DIFFERENTIAL/PLATELET
Basophils Absolute: 0.1 10*3/uL (ref 0.0–0.1)
Basophils Relative: 1 % (ref 0.0–3.0)
Eosinophils Absolute: 0.2 10*3/uL (ref 0.0–0.7)
Eosinophils Relative: 1.8 % (ref 0.0–5.0)
HCT: 45.5 % (ref 36.0–46.0)
Hemoglobin: 15 g/dL (ref 12.0–15.0)
Lymphocytes Relative: 34 % (ref 12.0–46.0)
Lymphs Abs: 3.1 10*3/uL (ref 0.7–4.0)
MCHC: 33 g/dL (ref 30.0–36.0)
MCV: 97.7 fl (ref 78.0–100.0)
Monocytes Absolute: 0.7 10*3/uL (ref 0.1–1.0)
Monocytes Relative: 7.7 % (ref 3.0–12.0)
Neutro Abs: 5.1 10*3/uL (ref 1.4–7.7)
Neutrophils Relative %: 55.5 % (ref 43.0–77.0)
Platelets: 326 10*3/uL (ref 150.0–400.0)
RBC: 4.65 Mil/uL (ref 3.87–5.11)
RDW: 13.1 % (ref 11.5–15.5)
WBC: 9.2 10*3/uL (ref 4.0–10.5)

## 2023-07-19 LAB — COMPREHENSIVE METABOLIC PANEL
ALT: 22 U/L (ref 0–35)
AST: 24 U/L (ref 0–37)
Albumin: 4.2 g/dL (ref 3.5–5.2)
Alkaline Phosphatase: 90 U/L (ref 39–117)
BUN: 15 mg/dL (ref 6–23)
CO2: 27 mEq/L (ref 19–32)
Calcium: 9.5 mg/dL (ref 8.4–10.5)
Chloride: 101 mEq/L (ref 96–112)
Creatinine, Ser: 0.64 mg/dL (ref 0.40–1.20)
GFR: 102 mL/min (ref >60.00)
Glucose, Bld: 103 mg/dL — ABNORMAL HIGH (ref 70–99)
Potassium: 4.6 mEq/L (ref 3.5–5.1)
Sodium: 137 mEq/L (ref 135–145)
Total Bilirubin: 0.3 mg/dL (ref 0.2–1.2)
Total Protein: 7.2 g/dL (ref 6.0–8.3)

## 2023-07-19 LAB — VITAMIN D 25 HYDROXY (VIT D DEFICIENCY, FRACTURES): VITD: 31.5 ng/mL (ref 30.00–100.00)

## 2023-07-19 LAB — HEMOGLOBIN A1C: Hgb A1c MFr Bld: 5.9 % (ref 4.6–6.5)

## 2023-07-23 ENCOUNTER — Encounter: Payer: Self-pay | Admitting: Physician Assistant

## 2023-07-26 ENCOUNTER — Ambulatory Visit: Payer: Commercial Managed Care - PPO | Admitting: Physician Assistant

## 2023-07-26 ENCOUNTER — Encounter: Payer: Self-pay | Admitting: Physician Assistant

## 2023-07-26 VITALS — BP 150/90 | HR 106 | Temp 98.4°F | Wt 204.6 lb

## 2023-07-26 DIAGNOSIS — Z0000 Encounter for general adult medical examination without abnormal findings: Secondary | ICD-10-CM

## 2023-07-26 DIAGNOSIS — R030 Elevated blood-pressure reading, without diagnosis of hypertension: Secondary | ICD-10-CM

## 2023-07-26 DIAGNOSIS — Z6837 Body mass index (BMI) 37.0-37.9, adult: Secondary | ICD-10-CM | POA: Diagnosis not present

## 2023-07-26 DIAGNOSIS — E669 Obesity, unspecified: Secondary | ICD-10-CM | POA: Diagnosis not present

## 2023-07-26 DIAGNOSIS — E785 Hyperlipidemia, unspecified: Secondary | ICD-10-CM | POA: Diagnosis not present

## 2023-07-26 DIAGNOSIS — Z1211 Encounter for screening for malignant neoplasm of colon: Secondary | ICD-10-CM

## 2023-07-26 MED ORDER — ATORVASTATIN CALCIUM 40 MG PO TABS
40.0000 mg | ORAL_TABLET | Freq: Every evening | ORAL | 3 refills | Status: DC
Start: 2023-07-26 — End: 2024-08-21

## 2023-07-26 MED ORDER — SEMAGLUTIDE-WEIGHT MANAGEMENT 0.25 MG/0.5ML ~~LOC~~ SOAJ
0.2500 mg | SUBCUTANEOUS | 0 refills | Status: DC
Start: 2023-07-26 — End: 2023-09-05

## 2023-07-26 NOTE — Patient Instructions (Signed)
A referral was sent to gastroenterology to schedule for your colonoscopy.  Please monitor your blood pressure at home.  You need to work on lifestyle changes to help reduce your blood pressure reading.  This includes increasing activity, drinking more water, cut out alcohol.  We may need to consider blood pressure medication if continue to trend above 140/80.

## 2023-07-26 NOTE — Assessment & Plan Note (Signed)
The 10-year ASCVD risk score (Arnett DK, et al., 2019) is: 1.2%   Values used to calculate the score:     Age: 52 years     Sex: Female     Is Non-Hispanic African American: No     Diabetic: No     Tobacco smoker: No     Systolic Blood Pressure: 150 mmHg     Is BP treated: No     HDL Cholesterol: 87.8 mg/dL     Total Cholesterol: 222 mg/dL  Chronic.  Stable with Lipitor 40 mg.  Refilled today.  Continue to work on lifestyle.

## 2023-07-26 NOTE — Assessment & Plan Note (Signed)
Needs to work on lifestyle changes.  I sent Becky Jensen today to see if this would be approved for her. Pt aware of risks vs benefits and possible adverse reactions.  She will follow-up with me sooner and more regularly if Becky Jensen is an option for her.

## 2023-07-26 NOTE — Progress Notes (Signed)
Subjective:    Patient ID: Becky Jensen, female    DOB: 24-Dec-1971, 52 y.o.   MRN: [id number]  Chief Complaint  Patient presents with   Annual Exam    HPI Patient is in today for annual exam.   Interim hx:  -Doing HRT since September of 2023 through her GYN - taking progesterone and estradiol as directed.  -Working with [employer] about Vyvanse and ADHD.  Health maintenance: Lifestyle/ exercise: none currently, binging tendencies at night after the come-down from vyvanse Nutrition: no current meal plans  Mental health: doing really good per pt Sleep: so-so Substance use: none  ETOH: wine nightly  Sexual activity: monogamous  Immunizations: UTD Colonoscopy: Cologuard neg, but would like to do a colonoscopy due to brother with polyp Pap: UTD Mammogram: UTD Skin: following with dermatology next month    Past Medical History:  Diagnosis Date   Depression    Dysmenorrhea    Hyperlipidemia    Infertility, female    Skin Cancer     Past Surgical History:  Procedure Laterality Date   ANKLE FRACTURE SURGERY Right 08/2018   FRACTURE SURGERY  September 2019   TONSILLECTOMY      Family History  Problem Relation Age of Onset   Hyperlipidemia Mother    Hypertension Mother    Transient ischemic attack Mother    Arthritis Mother    Stroke Mother    Hypertension Father    Obesity Father    Cancer Maternal Grandmother 32       stomach   Heart disease Maternal Grandfather    Alcohol abuse Maternal Grandfather    Cancer Paternal Grandmother        NH lymphoma   Hypertension Paternal Grandfather    Stroke Paternal Grandfather    Alcohol abuse Paternal Grandfather    Breast cancer Maternal Aunt 40   Cancer Maternal Aunt    Breast cancer Cousin    ADD / ADHD Sister    Cancer Brother    Hyperlipidemia Brother    Heart disease Maternal Uncle    Hyperlipidemia Maternal Aunt    Hyperlipidemia Maternal Uncle    Hyperlipidemia Sister    Varicose Veins  Paternal Aunt    Varicose Veins Paternal Aunt     Social History   Tobacco Use   Smoking status: Former    Current packs/day: 0.00    Types: Cigarettes    Quit date: 06/09/2009    Years since quitting: 14.1   Smokeless tobacco: Never   Tobacco comments:    sporadic  Vaping Use   Vaping status: Never Used  Substance Use Topics   Alcohol use: Yes    Alcohol/week: 11.0 standard drinks of alcohol    Types: 7 Glasses of wine, 4 Cans of beer per week    Comment: 10 drinks per week   Drug use: No     No Known Allergies  Review of Systems NEGATIVE UNLESS OTHERWISE INDICATED IN HPI      Objective:     BP (!) 150/90 (BP Location: Left Arm, Patient Position: Sitting, Cuff Size: Large)   Pulse (!) 106   Temp 98.4 F (36.9 C) (Temporal)   Wt 204 lb 9.6 oz (92.8 kg)   LMP 06/10/2023   SpO2 95%   BMI 37.42 kg/m   Wt Readings from Last 3 Encounters:  07/26/23 204 lb 9.6 oz (92.8 kg)  09/16/22 200 lb 6.4 oz (90.9 kg)  08/17/22 202 lb (91.6 kg)  BP Readings from Last 3 Encounters:  07/26/23 (!) 150/90  09/16/22 (!) 162/102  08/17/22 122/82     Physical Exam Vitals and nursing note reviewed.  Constitutional:      Appearance: Normal appearance. She is obese. She is not toxic-appearing.  HENT:     Head: Normocephalic and atraumatic.     Right Ear: Tympanic membrane, ear canal and external ear normal.     Left Ear: Tympanic membrane, ear canal and external ear normal.     Nose: Nose normal.     Mouth/Throat:     Mouth: Mucous membranes are moist.  Eyes:     Extraocular Movements: Extraocular movements intact.     Conjunctiva/sclera: Conjunctivae normal.     Pupils: Pupils are equal, round, and reactive to light.  Cardiovascular:     Rate and Rhythm: Normal rate and regular rhythm.     Pulses: Normal pulses.     Heart sounds: Normal heart sounds.  Pulmonary:     Effort: Pulmonary effort is normal.     Breath sounds: Normal breath sounds.  Abdominal:      General: Abdomen is flat. Bowel sounds are normal.     Palpations: Abdomen is soft.  Musculoskeletal:        General: Normal range of motion.     Cervical back: Normal range of motion and neck supple.  Skin:    General: Skin is warm and dry.  Neurological:     General: No focal deficit present.     Mental Status: She is alert and oriented to person, place, and time.  Psychiatric:        Mood and Affect: Mood normal.        Behavior: Behavior normal.        Thought Content: Thought content normal.        Judgment: Judgment normal.        Assessment & Plan:  Encounter for annual physical exam  Obesity, Class II, BMI 35-39.9 Assessment & Plan: Needs to work on lifestyle changes.  I sent Reginal Lutes today to see if this would be approved for her. Pt aware of risks vs benefits and possible adverse reactions.  She will follow-up with me sooner and more regularly if Reginal Lutes is an option for her.  Orders: -     Atorvastatin Calcium; Take 1 tablet (40 mg total) by mouth every evening.  Dispense: 90 tablet; Refill: 3 -     Semaglutide-Weight Management; Inject 0.25 mg into the skin once a week.  Dispense: 2 mL; Refill: 0  Dyslipidemia Assessment & Plan: The 10-year ASCVD risk score (Arnett DK, et al., 2019) is: 1.2%   Values used to calculate the score:     Age: 66 years     Sex: Female     Is Non-Hispanic African American: No     Diabetic: No     Tobacco smoker: No     Systolic Blood Pressure: 150 mmHg     Is BP treated: No     HDL Cholesterol: 87.8 mg/dL     Total Cholesterol: 222 mg/dL  Chronic.  Stable with Lipitor 40 mg.  Refilled today.  Continue to work on lifestyle.  Orders: -     Atorvastatin Calcium; Take 1 tablet (40 mg total) by mouth every evening.  Dispense: 90 tablet; Refill: 3 -     Semaglutide-Weight Management; Inject 0.25 mg into the skin once a week.  Dispense: 2 mL; Refill: 0  Screening for colon cancer -  Ambulatory referral to Gastroenterology  Elevated  blood pressure reading    Age-appropriate screening and counseling performed today.  Blood work previously done and we went over those labs together today.  Preventive measures discussed and printed in AVS for patient.   Patient Counseling: [x]   Nutrition: Stressed importance of moderation in sodium/caffeine intake, saturated fat and cholesterol, caloric balance, sufficient intake of fresh fruits, vegetables, and fiber.  [x]   Stressed the importance of regular exercise.   [x]   Substance Abuse: Discussed cessation/primary prevention of tobacco, alcohol, or other drug use; driving or other dangerous activities under the influence; availability of treatment for abuse.   []   Injury prevention: Discussed safety belts, safety helmets, smoke detector, smoking near bedding or upholstery.   []   Sexuality: Discussed sexually transmitted diseases, partner selection, use of condoms, avoidance of unintended pregnancy  and contraceptive alternatives.   [x]   Dental health: Discussed importance of regular tooth brushing, flossing, and dental visits.  [x]   Health maintenance and immunizations reviewed. Please refer to Health maintenance section.         Return in about 1 month (around 08/26/2023) for blood pressure check.  This note was prepared with assistance of Conservation officer, historic buildings. Occasional wrong-word or sound-a-like substitutions may have occurred due to the inherent limitations of voice recognition software.     Jaedin Trumbo M Vernetta Dizdarevic, PA-C

## 2023-07-31 ENCOUNTER — Other Ambulatory Visit (HOSPITAL_COMMUNITY): Payer: Self-pay

## 2023-07-31 ENCOUNTER — Telehealth: Payer: Self-pay

## 2023-07-31 NOTE — Telephone Encounter (Signed)
Pharmacy Patient Advocate Encounter   Received notification from CoverMyMeds that prior authorization for Wegovy 0.25mg /0.47ml is required/requested.   Insurance verification completed.   The patient is insured through [facility] .   Per test claim: PA required; PA submitted to [facility] via CoverMyMeds Key/confirmation #/EOC [id number] Status is pending

## 2023-08-01 NOTE — Telephone Encounter (Signed)
Pharmacy Patient Advocate Encounter  Received notification from [facility] that Prior Authorization for Wegovy 0.25mg /0.3ml has been Denied due to the mediation is not covered by your insurance plan.   PA #/Case ID/Reference #: [id number]

## 2023-08-03 NOTE — Telephone Encounter (Signed)
Left message to return call to our office at their convenience.  

## 2023-08-10 ENCOUNTER — Encounter (INDEPENDENT_AMBULATORY_CARE_PROVIDER_SITE_OTHER): Payer: Self-pay

## 2023-08-26 ENCOUNTER — Encounter: Payer: Self-pay | Admitting: Physician Assistant

## 2023-08-29 NOTE — Telephone Encounter (Signed)
Please see pt msg and advise if patient should reschedule

## 2023-08-30 ENCOUNTER — Ambulatory Visit: Payer: Commercial Managed Care - PPO | Admitting: Physician Assistant

## 2023-09-05 ENCOUNTER — Ambulatory Visit (INDEPENDENT_AMBULATORY_CARE_PROVIDER_SITE_OTHER): Payer: Commercial Managed Care - PPO | Admitting: Radiology

## 2023-09-05 ENCOUNTER — Encounter: Payer: Self-pay | Admitting: Radiology

## 2023-09-05 VITALS — BP 130/78 | HR 106 | Resp 14 | Ht 62.0 in | Wt 202.0 lb

## 2023-09-05 DIAGNOSIS — R61 Generalized hyperhidrosis: Secondary | ICD-10-CM | POA: Diagnosis not present

## 2023-09-05 DIAGNOSIS — N951 Menopausal and female climacteric states: Secondary | ICD-10-CM | POA: Diagnosis not present

## 2023-09-05 DIAGNOSIS — Z01419 Encounter for gynecological examination (general) (routine) without abnormal findings: Secondary | ICD-10-CM | POA: Diagnosis not present

## 2023-09-05 MED ORDER — PROGESTERONE 200 MG PO CAPS
200.0000 mg | ORAL_CAPSULE | Freq: Every day | ORAL | 4 refills | Status: DC
Start: 2023-09-05 — End: 2024-04-01

## 2023-09-05 MED ORDER — ESTRADIOL 0.025 MG/24HR TD PTTW
1.0000 | MEDICATED_PATCH | TRANSDERMAL | 4 refills | Status: DC
Start: 2023-09-07 — End: 2024-09-17

## 2023-09-05 NOTE — Progress Notes (Signed)
Becky Jensen 07/26/1971 [id number]   History:  52 y.o. G1P0 presents for annual exam. Doing better on HRT, periods are sporadic only taking progesterone 12 days/month. No other gyn concerns.  Gynecologic History Patient's last menstrual period was 08/10/2023. Period Duration (Days): 4-5 Period Pattern: (!) Irregular Menstrual Flow: Heavy Menstrual Control: Maxi pad, Tampon Menstrual Control Change Freq (Hours): changes pad/tampon every 4 hours Dysmenorrhea: None Contraception/Family planning: none Sexually active: yes Last Pap: 2022. Results were: normal Last mammogram: 7/24. Results were: normal  Obstetric History OB History  Gravida Para Term Preterm AB Living  1 0 0 0 1 0  SAB IAB Ectopic Multiple Live Births  1 0 0 0      # Outcome Date GA Lbr Len/2nd Weight Sex Type Anes PTL Lv  1 SAB              The following portions of the patient's history were reviewed and updated as appropriate: allergies, current medications, past family history, past medical history, past social history, past surgical history, and problem list.  Review of Systems Pertinent items noted in HPI and remainder of comprehensive ROS otherwise negative.   Past medical history, past surgical history, family history and social history were all reviewed and documented in the EPIC chart.   Exam:  Vitals:   09/05/23 1329  BP: 130/78  Pulse: (!) 106  Resp: 14  Weight: 202 lb (91.6 kg)  Height: 5\' 2"  (1.575 m)   Body mass index is 36.95 kg/m.  General appearance:  Normal, obese Thyroid:  Symmetrical, normal in size, without palpable masses or nodularity. Respiratory  Auscultation:  Clear without wheezing or rhonchi Cardiovascular  Auscultation:  Regular rate, without rubs, murmurs or gallops  Edema/varicosities:  Not grossly evident Abdominal  Soft,nontender, without masses, guarding or rebound.  Liver/spleen:  No organomegaly noted  Hernia:  None appreciated  Skin  Inspection:  Grossly  normal Breasts: Examined lying and sitting.   Right: Without masses, retractions, nipple discharge or axillary adenopathy.   Left: Without masses, retractions, nipple discharge or axillary adenopathy. Genitourinary   Inguinal/mons:  Normal without inguinal adenopathy  External genitalia:  Normal appearing vulva with no masses, tenderness, or lesions  BUS/Urethra/Skene's glands:  Normal without masses or exudate  Vagina:  Normal appearing with normal color and discharge, no lesions  Cervix:  Normal appearing without discharge or lesions  Uterus:  Normal in size, shape and contour.  Mobile, nontender  Adnexa/parametria:     Rt: Normal in size, without masses or tenderness.   Lt: Normal in size, without masses or tenderness.  Anus and perineum: Normal   Raynelle Fanning, CMA present for exam  Assessment/Plan:   1. Well woman exam with routine gynecological exam Pap due 2025 Mammogram up to date Colon cancer screening up to date  2. Night sweats Controlled with HRT Irregular periods, every 90-100 days, will switch to nightly progesterone  3. Perimenopause - estradiol (VIVELLE-DOT) 0.025 MG/24HR; Place 1 patch onto the skin 2 (two) times a week.  Dispense: 24 patch; Refill: 4 - progesterone (PROMETRIUM) 200 MG capsule; Take 1 capsule (200 mg total) by mouth daily. Take days #1-12 of the month at hs.  Dispense: 90 capsule; Refill: 4     Discussed SBE, colonoscopy and pap screening as directed/appropriate. Recommend of exercise weekly, including weight bearing exercise. Encouraged the use of seatbelts and sunscreen. Return in 1 year for annual or as needed.   Arlie Solomons B WHNP-BC 1:45 PM 09/05/2023

## 2023-09-18 ENCOUNTER — Other Ambulatory Visit: Payer: Self-pay | Admitting: Physician Assistant

## 2023-09-18 DIAGNOSIS — E669 Obesity, unspecified: Secondary | ICD-10-CM

## 2023-09-18 DIAGNOSIS — E785 Hyperlipidemia, unspecified: Secondary | ICD-10-CM

## 2023-09-18 DIAGNOSIS — E66812 Obesity, class 2: Secondary | ICD-10-CM

## 2023-09-21 ENCOUNTER — Encounter: Payer: Self-pay | Admitting: Physician Assistant

## 2023-09-25 NOTE — Telephone Encounter (Signed)
Spoke with [facility].  Was advised bith prescriptions, Progesterone and estradiol were picked up on 09/22/23.   No new Rx sent.   MyChart message to patient.   Encounter closed.

## 2023-10-25 ENCOUNTER — Encounter: Payer: Self-pay | Admitting: Physician Assistant

## 2023-10-25 NOTE — Telephone Encounter (Signed)
Please see pt message and advise; pt was due for 4 wk BP check in August. Statin meds sent in 90 day supply with refills.

## 2023-10-26 ENCOUNTER — Other Ambulatory Visit: Payer: Self-pay

## 2023-10-26 DIAGNOSIS — E66812 Obesity, class 2: Secondary | ICD-10-CM

## 2023-10-26 DIAGNOSIS — E785 Hyperlipidemia, unspecified: Secondary | ICD-10-CM

## 2023-10-26 NOTE — Telephone Encounter (Signed)
Called pharmacy since 90 day was sent 07/26/23 with 3 refills. Pharmacist advised pt prescription would be ready for pick up in 1 hour and would contact patient directly.

## 2023-10-31 NOTE — Telephone Encounter (Signed)
Routing to provider for final review and closing encounter.

## 2023-11-17 ENCOUNTER — Telehealth: Payer: Commercial Managed Care - PPO | Admitting: Physician Assistant

## 2023-11-17 DIAGNOSIS — J019 Acute sinusitis, unspecified: Secondary | ICD-10-CM | POA: Diagnosis not present

## 2023-11-17 DIAGNOSIS — B9689 Other specified bacterial agents as the cause of diseases classified elsewhere: Secondary | ICD-10-CM

## 2023-11-17 MED ORDER — AMOXICILLIN-POT CLAVULANATE 875-125 MG PO TABS
1.0000 | ORAL_TABLET | Freq: Two times a day (BID) | ORAL | 0 refills | Status: DC
Start: 2023-11-17 — End: 2024-03-18

## 2023-11-17 MED ORDER — PREDNISONE 20 MG PO TABS: 40.0000 mg | ORAL_TABLET | Freq: Every day | ORAL | 0 refills | Status: DC

## 2023-11-17 NOTE — Progress Notes (Signed)
Virtual Visit Consent   Becky Jensen, you are scheduled for a virtual visit with a [facility] provider today. Just as with appointments in the office, your consent must be obtained to participate. Your consent will be active for this visit and any virtual visit you may have with one of our providers in the next 365 days. If you have a MyChart account, a copy of this consent can be sent to you electronically.  As this is a virtual visit, video technology does not allow for your provider to perform a traditional examination. This may limit your provider's ability to fully assess your condition. If your provider identifies any concerns that need to be evaluated in person or the need to arrange testing (such as labs, EKG, etc.), we will make arrangements to do so. Although advances in technology are sophisticated, we cannot ensure that it will always work on either your end or our end. If the connection with a video visit is poor, the visit may have to be switched to a telephone visit. With either a video or telephone visit, we are not always able to ensure that we have a secure connection.  By engaging in this virtual visit, you consent to the provision of healthcare and authorize for your insurance to be billed (if applicable) for the services provided during this visit. Depending on your insurance coverage, you may receive a charge related to this service.  I need to obtain your verbal consent now. Are you willing to proceed with your visit today? Becky Jensen has provided verbal consent on 11/17/2023 for a virtual visit (video or telephone). [name], PA-C  Date: 11/17/2023 2:17 PM  Virtual Visit via Video Note   I, [name], connected with  Becky Jensen  ([id number], 03/14/1971) on 11/17/23 at  2:15 PM EST by a video-enabled telemedicine application and verified that I am speaking with the correct person using two identifiers.  Location: Patient: Virtual Visit Location Patient:  Home Provider: Virtual Visit Location Provider: Home Office   I discussed the limitations of evaluation and management by telemedicine and the availability of in person appointments. The patient expressed understanding and agreed to proceed.    History of Present Illness: Becky Jensen is a 52 y.o. who identifies as a female who was assigned female at birth, and is being seen today for head congestion.  HPI: URI  This is a new problem. The current episode started 1 to 4 weeks ago (10 days). The problem has been gradually worsening. Maximum temperature: subjective fevers. The fever has been present for 1 to 2 days. Associated symptoms include congestion, coughing (in the morning), ear pain, headaches, a plugged ear sensation and sinus pain. Pertinent negatives include no diarrhea, nausea, rhinorrhea, sore throat, vomiting or wheezing. Associated symptoms comments: Fatigue. She has tried decongestant (sudafed, ibuprofen) for the symptoms. The treatment provided no relief.     Problems:  Patient Active Problem List   Diagnosis Date Noted   Encounter for annual physical exam 07/26/2023   Dyslipidemia 09/16/2022   Obesity, Class II, BMI 35-39.9 09/16/2022   Vitamin D deficiency 09/16/2022   Anxiety and depression 07/27/2022   Family history of high cholesterol 07/27/2022   Osteoporosis 07/27/2022   Perimenopause 07/27/2022   Bimalleolar ankle fracture, right, closed, initial encounter 09/05/2018   Infertility, female 03/20/2015   Attention deficit disorder of adult 12/26/1978    Allergies: No Known Allergies Medications:  Current Outpatient Medications:    amoxicillin-clavulanate (  AUGMENTIN) 875-125 MG tablet, Take 1 tablet by mouth 2 (two) times daily., Disp: 20 tablet, Rfl: 0   predniSONE (DELTASONE) 20 MG tablet, Take 2 tablets (40 mg total) by mouth daily with breakfast., Disp: 10 tablet, Rfl: 0   atorvastatin (LIPITOR) 40 MG tablet, Take 1 tablet (40 mg total) by mouth every evening.,  Disp: 90 tablet, Rfl: 3   estradiol (VIVELLE-DOT) 0.025 MG/24HR, Place 1 patch onto the skin 2 (two) times a week., Disp: 24 patch, Rfl: 4   Multiple Vitamin (MULTIVITAMIN WITH MINERALS) TABS tablet, Take 1 tablet by mouth daily., Disp: , Rfl:    progesterone (PROMETRIUM) 200 MG capsule, Take 1 capsule (200 mg total) by mouth daily. Take days #1-12 of the month at hs., Disp: 90 capsule, Rfl: 4   VYVANSE 30 MG capsule, Take 30 mg by mouth daily., Disp: , Rfl:   Observations/Objective: Patient is well-developed, well-nourished in no acute distress.  Resting comfortably at home.  Head is normocephalic, atraumatic.  No labored breathing.  Speech is clear and coherent with logical content.  Patient is alert and oriented at baseline.    Assessment and Plan: 1. Acute bacterial sinusitis - amoxicillin-clavulanate (AUGMENTIN) 875-125 MG tablet; Take 1 tablet by mouth 2 (two) times daily.  Dispense: 20 tablet; Refill: 0 - predniSONE (DELTASONE) 20 MG tablet; Take 2 tablets (40 mg total) by mouth daily with breakfast.  Dispense: 10 tablet; Refill: 0  - Worsening symptoms that have not responded to OTC medications.  - Will give Augmentin and Prednisone - Continue allergy medications.  - Steam and humidifier can help - Stay well hydrated and get plenty of rest.  - Seek in person evaluation if no symptom improvement or if symptoms worsen   Follow Up Instructions: I discussed the assessment and treatment plan with the patient. The patient was provided an opportunity to ask questions and all were answered. The patient agreed with the plan and demonstrated an understanding of the instructions.  A copy of instructions were sent to the patient via MyChart unless otherwise noted below.    The patient was advised to call back or seek an in-person evaluation if the symptoms worsen or if the condition fails to improve as anticipated.    [name], PA-C

## 2023-11-17 NOTE — Patient Instructions (Signed)
Verlon Setting Eakins, thank you for joining [name], PA-C for today's virtual visit.  While this provider is not your primary care provider (PCP), if your PCP is located in our provider database this encounter information will be shared with them immediately following your visit.   A [facility] MyChart account gives you access to today's visit and all your visits, tests, and labs performed at [facility] " click here if you don't have a [facility] MyChart account or go to [email or web address]  Consent: (Patient) Becky Jensen provided verbal consent for this virtual visit at the beginning of the encounter.  Current Medications:  Current Outpatient Medications:    amoxicillin-clavulanate (AUGMENTIN) 875-125 MG tablet, Take 1 tablet by mouth 2 (two) times daily., Disp: 20 tablet, Rfl: 0   predniSONE (DELTASONE) 20 MG tablet, Take 2 tablets (40 mg total) by mouth daily with breakfast., Disp: 10 tablet, Rfl: 0   atorvastatin (LIPITOR) 40 MG tablet, Take 1 tablet (40 mg total) by mouth every evening., Disp: 90 tablet, Rfl: 3   estradiol (VIVELLE-DOT) 0.025 MG/24HR, Place 1 patch onto the skin 2 (two) times a week., Disp: 24 patch, Rfl: 4   Multiple Vitamin (MULTIVITAMIN WITH MINERALS) TABS tablet, Take 1 tablet by mouth daily., Disp: , Rfl:    progesterone (PROMETRIUM) 200 MG capsule, Take 1 capsule (200 mg total) by mouth daily. Take days #1-12 of the month at hs., Disp: 90 capsule, Rfl: 4   VYVANSE 30 MG capsule, Take 30 mg by mouth daily., Disp: , Rfl:    Medications ordered in this encounter:  Meds ordered this encounter  Medications   amoxicillin-clavulanate (AUGMENTIN) 875-125 MG tablet    Sig: Take 1 tablet by mouth 2 (two) times daily.    Dispense:  20 tablet    Refill:  0    Order Specific Question:   Supervising Provider    Answer:   Merrilee Jansky [[id number]]   predniSONE (DELTASONE) 20 MG tablet    Sig: Take 2 tablets (40 mg total) by mouth daily with  breakfast.    Dispense:  10 tablet    Refill:  0    Order Specific Question:   Supervising Provider    Answer:   Merrilee Jansky [[id number]]     *If you need refills on other medications prior to your next appointment, please contact your pharmacy*  Follow-Up: Call back or seek an in-person evaluation if the symptoms worsen or if the condition fails to improve as anticipated.  [facility] [phone]  Other Instructions Sinus Infection, Adult A sinus infection, also called sinusitis, is inflammation of your sinuses. Sinuses are hollow spaces in the bones around your face. Your sinuses are located: Around your eyes. In the middle of your forehead. Behind your nose. In your cheekbones. Mucus normally drains out of your sinuses. When your nasal tissues become inflamed or swollen, mucus can become trapped or blocked. This allows bacteria, viruses, and fungi to grow, which leads to infection. Most infections of the sinuses are caused by a virus. A sinus infection can develop quickly. It can last for up to 4 weeks (acute) or for more than 12 weeks (chronic). A sinus infection often develops after a cold. What are the causes? This condition is caused by anything that creates swelling in the sinuses or stops mucus from draining. This includes: Allergies. Asthma. Infection from bacteria or viruses. Deformities or blockages in your nose or sinuses. Abnormal growths in the nose (  nasal polyps). Pollutants, such as chemicals or irritants in the air. Infection from fungi. This is rare. What increases the risk? You are more likely to develop this condition if you: Have a weak body defense system (immune system). Do a lot of swimming or diving. Overuse nasal sprays. Smoke. What are the signs or symptoms? The main symptoms of this condition are pain and a feeling of pressure around the affected sinuses. Other symptoms include: Stuffy nose or congestion that makes it difficult  to breathe through your nose. Thick yellow or greenish drainage from your nose. Tenderness, swelling, and warmth over the affected sinuses. A cough that may get worse at night. Decreased sense of smell and taste. Extra mucus that collects in the throat or the back of the nose (postnasal drip) causing a sore throat or bad breath. Tiredness (fatigue). Fever. How is this diagnosed? This condition is diagnosed based on: Your symptoms. Your medical history. A physical exam. Tests to find out if your condition is acute or chronic. This may include: Checking your nose for nasal polyps. Viewing your sinuses using a device that has a light (endoscope). Testing for allergies or bacteria. Imaging tests, such as an MRI or CT scan. In rare cases, a bone biopsy may be done to rule out more serious types of fungal sinus disease. How is this treated? Treatment for a sinus infection depends on the cause and whether your condition is chronic or acute. If caused by a virus, your symptoms should go away on their own within 10 days. You may be given medicines to relieve symptoms. They include: Medicines that shrink swollen nasal passages (decongestants). A spray that eases inflammation of the nostrils (topical intranasal corticosteroids). Rinses that help get rid of thick mucus in your nose (nasal saline washes). Medicines that treat allergies (antihistamines). Over-the-counter pain relievers. If caused by bacteria, your health care provider may recommend waiting to see if your symptoms improve. Most bacterial infections will get better without antibiotic medicine. You may be given antibiotics if you have: A severe infection. A weak immune system. If caused by narrow nasal passages or nasal polyps, surgery may be needed. Follow these instructions at home: Medicines Take, use, or apply over-the-counter and prescription medicines only as told by your health care provider. These may include nasal  sprays. If you were prescribed an antibiotic medicine, take it as told by your health care provider. Do not stop taking the antibiotic even if you start to feel better. Hydrate and humidify  Drink enough fluid to keep your urine pale yellow. Staying hydrated will help to thin your mucus. Use a cool mist humidifier to keep the humidity level in your home above 50%. Inhale steam for 10-15 minutes, 3-4 times a day, or as told by your health care provider. You can do this in the bathroom while a hot shower is running. Limit your exposure to cool or dry air. Rest Rest as much as possible. Sleep with your head raised (elevated). Make sure you get enough sleep each night. General instructions  Apply a warm, moist washcloth to your face 3-4 times a day or as told by your health care provider. This will help with discomfort. Use nasal saline washes as often as told by your health care provider. Wash your hands often with soap and water to reduce your exposure to germs. If soap and water are not available, use hand sanitizer. Do not smoke. Avoid being around people who are smoking (secondhand smoke). Keep all follow-up visits.  This is important. Contact a health care provider if: You have a fever. Your symptoms get worse. Your symptoms do not improve within 10 days. Get help right away if: You have a severe headache. You have persistent vomiting. You have severe pain or swelling around your face or eyes. You have vision problems. You develop confusion. Your neck is stiff. You have trouble breathing. These symptoms may be an emergency. Get help right away. Call 911. Do not wait to see if the symptoms will go away. Do not drive yourself to the hospital. Summary A sinus infection is soreness and inflammation of your sinuses. Sinuses are hollow spaces in the bones around your face. This condition is caused by nasal tissues that become inflamed or swollen. The swelling traps or blocks the flow  of mucus. This allows bacteria, viruses, and fungi to grow, which leads to infection. If you were prescribed an antibiotic medicine, take it as told by your health care provider. Do not stop taking the antibiotic even if you start to feel better. Keep all follow-up visits. This is important. This information is not intended to replace advice given to you by your health care provider. Make sure you discuss any questions you have with your health care provider. Document Revised: 11/16/2021 Document Reviewed: 11/16/2021 Elsevier Patient Education  2024 Elsevier Inc.    If you have been instructed to have an in-person evaluation today at a local Urgent Care facility, please use the link below. It will take you to a list of all of our available [facility], including address, phone number and hours of operation. Please do not delay care.  [facility]  If you or a family member do not have a primary care provider, use the link below to schedule a visit and establish care. When you choose a [facility] primary care physician or advanced practice provider, you gain a long-term partner in health. Find a Primary Care Provider  Learn more about [facility] in-office and virtual care options:  - Get Care Now

## 2024-01-16 ENCOUNTER — Ambulatory Visit: Payer: Commercial Managed Care - PPO | Admitting: Physician Assistant

## 2024-02-29 ENCOUNTER — Encounter: Payer: Self-pay | Admitting: Physician Assistant

## 2024-02-29 NOTE — Telephone Encounter (Signed)
 Please see pt msg and advise

## 2024-03-11 ENCOUNTER — Ambulatory Visit: Admitting: Physician Assistant

## 2024-03-13 ENCOUNTER — Telehealth: Payer: Self-pay | Admitting: Physician Assistant

## 2024-03-13 NOTE — Telephone Encounter (Signed)
 Patient has No Showed 3 times. Do we need to discharge? Please advise.  OV-03/11/24 OV-01/16/24 OV-08/30/23

## 2024-03-18 ENCOUNTER — Ambulatory Visit: Admitting: Physician Assistant

## 2024-03-18 ENCOUNTER — Encounter: Payer: Self-pay | Admitting: Physician Assistant

## 2024-03-18 VITALS — BP 138/86 | HR 87 | Temp 98.1°F | Ht 62.0 in | Wt 199.6 lb

## 2024-03-18 DIAGNOSIS — I10 Essential (primary) hypertension: Secondary | ICD-10-CM | POA: Diagnosis not present

## 2024-03-18 DIAGNOSIS — E66812 Obesity, class 2: Secondary | ICD-10-CM

## 2024-03-18 DIAGNOSIS — Z7689 Persons encountering health services in other specified circumstances: Secondary | ICD-10-CM

## 2024-03-18 DIAGNOSIS — E6609 Other obesity due to excess calories: Secondary | ICD-10-CM

## 2024-03-18 DIAGNOSIS — Z6836 Body mass index (BMI) 36.0-36.9, adult: Secondary | ICD-10-CM | POA: Diagnosis not present

## 2024-03-18 MED ORDER — TIRZEPATIDE-WEIGHT MANAGEMENT 2.5 MG/0.5ML ~~LOC~~ SOLN
2.5000 mg | SUBCUTANEOUS | 0 refills | Status: DC
Start: 2024-03-18 — End: 2024-04-04

## 2024-03-18 NOTE — Progress Notes (Signed)
 Patient ID: Becky Jensen, female    DOB: April 24, 1971, 53 y.o.   MRN: [id number]   Assessment & Plan:  Encounter for weight management -     Tirzepatide-Weight Management; Inject 2.5 mg into the skin once a week.  Dispense: 2 mL; Refill: 0  Class 2 obesity due to excess calories without serious comorbidity with body mass index (BMI) of 36.0 to 36.9 in adult -     Tirzepatide-Weight Management; Inject 2.5 mg into the skin once a week.  Dispense: 2 mL; Refill: 0  Essential hypertension    Assessment & Plan Obesity She is exploring weight loss management options and is interested in starting a weight loss medication. Insurance does not cover Wegovy, so Zepbound vials are considered a more cost-effective and potentially more effective alternative. Zepbound may provide better results than Wegovy or Ozempic, with a more manageable price. The dosing schedule involves starting at 2.5 mg for a month, then increasing to 5 mg as tolerated. She is motivated to start the medication to aid her weight loss journey and improve her routine. - Prescribe Zepbound vials starting at 2.5 mg for a month, then increase to 5 mg as tolerated. Pt aware of risks vs benefits and possible adverse reactions. - Instruct her to message after the third shot to report progress. - Encourage lifestyle modifications including diet and exercise.  Hypertension Blood pressure has been variable, with readings ranging from 122/80 to 158/98. She is concerned about the impact of Vyvanse on her blood pressure. During the visit, her blood pressure was 138/86, an improvement from earlier readings. She will monitor her blood pressure at home to assess for any patterns or consistent elevations. - Instruct her to monitor blood pressure at home for 4-6 weeks and bring log to next appointment. - Consider medication such as amlodipine or hydrochlorothiazide if readings consistently over 140/90. - Compare home blood pressure cuff readings with  office readings.        Return in about 6 weeks (around 04/29/2024) for recheck/follow-up.    Subjective:    Chief Complaint  Patient presents with   Weight Loss    Pt in office wanting to discuss Wegovy/compound options; pt in to update BMI; pt also wanting to have BP checked in office again due to missing last appt for BP update when having the flu; pt admits to needing refill of Statin been out for a few months    HPI Discussed the use of AI scribe software for clinical note transcription with the patient, who gave verbal consent to proceed.  History of Present Illness Becky Jensen is a 53 year old female who presents with concerns about weight management and blood pressure control.  She is interested in starting a new medication to aid in weight loss. She has not had a menstrual period since June, which she feels has made it easier to stick to a routine, although it has not significantly impacted her exercise and weight loss efforts. She hopes a new medication will help her establish new routines and provide motivation.  Her blood pressure has been high, although she is unsure of the exact readings due to missing appointments after a sinus infection and the flu. She has been off Lipitor during this time. Her blood pressure readings have been inconsistent, ranging from 122 to 140. She is concerned about the impact of Vyvanse on her blood pressure and has reduced her Vyvanse dosage due to sleep disturbances, although she is uncertain if  the sleep issues were related to Vyvanse or other factors.  She discusses her ADHD, noting that positive feedback and results help her maintain motivation and routine. Her ADHD symptoms became more challenging with the onset of perimenopausal changes.     Past Medical History:  Diagnosis Date   Depression    Dysmenorrhea    Hyperlipidemia    Infertility, female    Skin Cancer     Past Surgical History:  Procedure Laterality Date   ANKLE  FRACTURE SURGERY Right 08/2018   FRACTURE SURGERY  September 2019   TONSILLECTOMY      Family History  Problem Relation Age of Onset   Hyperlipidemia Mother    Hypertension Mother    Transient ischemic attack Mother    Arthritis Mother    Stroke Mother    Hypertension Father    Obesity Father    ADD / ADHD Sister    Hyperlipidemia Sister    Cancer Brother        colon   Hyperlipidemia Brother    Breast cancer Maternal Aunt 40   Cancer Maternal Aunt    Hyperlipidemia Maternal Aunt    Heart disease Maternal Uncle    Hyperlipidemia Maternal Uncle    Varicose Veins Paternal Aunt    Varicose Veins Paternal Aunt    Cancer Maternal Grandmother 65       stomach   Heart disease Maternal Grandfather    Alcohol abuse Maternal Grandfather    Cancer Paternal Grandmother        NH lymphoma   Hypertension Paternal Grandfather    Stroke Paternal Grandfather    Alcohol abuse Paternal Grandfather    Breast cancer Cousin     Social History   Tobacco Use   Smoking status: Former    Current packs/day: 0.00    Types: Cigarettes    Quit date: 06/09/2009    Years since quitting: 14.7   Smokeless tobacco: Never   Tobacco comments:    sporadic  Vaping Use   Vaping status: Never Used  Substance Use Topics   Alcohol use: Yes    Alcohol/week: 11.0 standard drinks of alcohol    Types: 7 Glasses of wine, 4 Cans of beer per week    Comment: 10 drinks per week   Drug use: No     No Known Allergies  Review of Systems NEGATIVE UNLESS OTHERWISE INDICATED IN HPI      Objective:     BP 138/86 (BP Location: Right Arm, Patient Position: Sitting, Cuff Size: Large) Comment (Cuff Size): Manual reading  Pulse 87   Temp 98.1 F (36.7 C) (Temporal)   Ht 5\' 2"  (1.575 m)   Wt 199 lb 9.6 oz (90.5 kg)   LMP 05/27/2023 (Approximate)   SpO2 96%   BMI 36.51 kg/m   Wt Readings from Last 3 Encounters:  03/18/24 199 lb 9.6 oz (90.5 kg)  09/05/23 202 lb (91.6 kg)  07/26/23 204 lb 9.6 oz  (92.8 kg)    BP Readings from Last 3 Encounters:  03/18/24 138/86  09/05/23 130/78  07/26/23 (!) 150/90     Physical Exam Vitals and nursing note reviewed.  Constitutional:      Appearance: Normal appearance. She is obese. She is not toxic-appearing.  HENT:     Head: Normocephalic and atraumatic.  Eyes:     Extraocular Movements: Extraocular movements intact.     Conjunctiva/sclera: Conjunctivae normal.     Pupils: Pupils are equal, round, and reactive to light.  Cardiovascular:     Rate and Rhythm: Normal rate and regular rhythm.     Pulses: Normal pulses.     Heart sounds: Normal heart sounds.  Pulmonary:     Effort: Pulmonary effort is normal.     Breath sounds: Normal breath sounds.  Musculoskeletal:        General: Normal range of motion.     Cervical back: Normal range of motion and neck supple.     Right lower leg: No edema.     Left lower leg: No edema.  Skin:    General: Skin is warm and dry.  Neurological:     General: No focal deficit present.     Mental Status: She is alert and oriented to person, place, and time.  Psychiatric:        Mood and Affect: Mood normal.        Behavior: Behavior normal.             Emilyann Banka M Macaria Bias, PA-C

## 2024-03-19 ENCOUNTER — Ambulatory Visit: Admitting: Physician Assistant

## 2024-03-31 DIAGNOSIS — N951 Menopausal and female climacteric states: Secondary | ICD-10-CM

## 2024-04-01 MED ORDER — PROGESTERONE 200 MG PO CAPS
200.0000 mg | ORAL_CAPSULE | Freq: Every day | ORAL | 0 refills | Status: DC
Start: 2024-04-01 — End: 2024-06-26

## 2024-04-01 NOTE — Telephone Encounter (Signed)
 Per TW:  "Looks like Jami meant to send in Prometrium to take daily but still had the old instructions on there. Please let patient know to switch to daily and just check on the refills."  Spoke w/ [facility] and they reported that they are just going to close out the current rx since two different sigs on it and will wait for [place] to send new rx with new/correct sig and will get filled for the pt.   Last AEX 09/05/2023-JC Next AEX-nothing currently, recall placed for 2025.  Last mammo-07/18/2023-WNL   Rx pend.

## 2024-04-04 ENCOUNTER — Other Ambulatory Visit: Payer: Self-pay | Admitting: Physician Assistant

## 2024-04-04 DIAGNOSIS — Z7689 Persons encountering health services in other specified circumstances: Secondary | ICD-10-CM

## 2024-04-04 DIAGNOSIS — E6609 Other obesity due to excess calories: Secondary | ICD-10-CM

## 2024-04-22 ENCOUNTER — Telehealth: Admitting: Physician Assistant

## 2024-04-22 DIAGNOSIS — J069 Acute upper respiratory infection, unspecified: Secondary | ICD-10-CM

## 2024-04-22 MED ORDER — BENZONATATE 100 MG PO CAPS: 100.0000 mg | ORAL_CAPSULE | Freq: Three times a day (TID) | ORAL | 0 refills | Status: DC | PRN

## 2024-04-22 MED ORDER — FLUTICASONE PROPIONATE 50 MCG/ACT NA SUSP: 2.0000 | Freq: Every day | NASAL | 0 refills | Status: DC

## 2024-04-22 NOTE — Progress Notes (Signed)

## 2024-04-25 ENCOUNTER — Other Ambulatory Visit: Payer: Self-pay | Admitting: Physician Assistant

## 2024-04-25 DIAGNOSIS — E6609 Other obesity due to excess calories: Secondary | ICD-10-CM

## 2024-04-25 DIAGNOSIS — Z7689 Persons encountering health services in other specified circumstances: Secondary | ICD-10-CM

## 2024-04-28 ENCOUNTER — Telehealth: Admitting: Family Medicine

## 2024-04-28 DIAGNOSIS — J40 Bronchitis, not specified as acute or chronic: Secondary | ICD-10-CM

## 2024-04-28 MED ORDER — AZITHROMYCIN 250 MG PO TABS: ORAL_TABLET | ORAL | 0 refills | Status: AC

## 2024-04-28 MED ORDER — BENZONATATE 200 MG PO CAPS: 200.0000 mg | ORAL_CAPSULE | Freq: Two times a day (BID) | ORAL | 0 refills | Status: DC | PRN

## 2024-04-28 NOTE — Progress Notes (Signed)

## 2024-05-15 ENCOUNTER — Other Ambulatory Visit: Payer: Self-pay | Admitting: Physician Assistant

## 2024-05-15 DIAGNOSIS — Z7689 Persons encountering health services in other specified circumstances: Secondary | ICD-10-CM

## 2024-05-15 DIAGNOSIS — E66812 Obesity, class 2: Secondary | ICD-10-CM

## 2024-06-06 ENCOUNTER — Other Ambulatory Visit: Payer: Self-pay | Admitting: Physician Assistant

## 2024-06-06 DIAGNOSIS — Z7689 Persons encountering health services in other specified circumstances: Secondary | ICD-10-CM

## 2024-06-06 DIAGNOSIS — E6609 Other obesity due to excess calories: Secondary | ICD-10-CM

## 2024-06-12 ENCOUNTER — Other Ambulatory Visit: Payer: Self-pay | Admitting: Physician Assistant

## 2024-06-12 DIAGNOSIS — Z1231 Encounter for screening mammogram for malignant neoplasm of breast: Secondary | ICD-10-CM

## 2024-06-26 ENCOUNTER — Other Ambulatory Visit: Payer: Self-pay | Admitting: Nurse Practitioner

## 2024-06-26 DIAGNOSIS — N951 Menopausal and female climacteric states: Secondary | ICD-10-CM

## 2024-06-26 NOTE — Telephone Encounter (Signed)
 Med refill request: progesterone  200 mg Last AEX: 09/05/23 Next AEX: 09/17/24  Last MMG (if hormonal med) 07/18/23 BI-RADS 1 negative Refill authorized: Please Advise?

## 2024-07-18 ENCOUNTER — Ambulatory Visit
Admission: RE | Admit: 2024-07-18 | Discharge: 2024-07-18 | Disposition: A | Discharge status: Home / Self Care | Source: Ambulatory Visit | Attending: Physician Assistant

## 2024-07-18 DIAGNOSIS — Z1231 Encounter for screening mammogram for malignant neoplasm of breast: Secondary | ICD-10-CM

## 2024-07-23 ENCOUNTER — Ambulatory Visit: Payer: Self-pay | Admitting: Physician Assistant

## 2024-08-20 ENCOUNTER — Ambulatory Visit: Payer: Self-pay | Admitting: Physician Assistant

## 2024-08-20 ENCOUNTER — Ambulatory Visit: Admitting: Physician Assistant

## 2024-08-20 VITALS — BP 138/82 | HR 94 | Temp 97.9°F | Ht 62.0 in | Wt 184.6 lb

## 2024-08-20 DIAGNOSIS — Z800 Family history of malignant neoplasm of digestive organs: Secondary | ICD-10-CM

## 2024-08-20 DIAGNOSIS — E785 Hyperlipidemia, unspecified: Secondary | ICD-10-CM

## 2024-08-20 DIAGNOSIS — Z23 Encounter for immunization: Secondary | ICD-10-CM

## 2024-08-20 DIAGNOSIS — Z6833 Body mass index (BMI) 33.0-33.9, adult: Secondary | ICD-10-CM

## 2024-08-20 DIAGNOSIS — F909 Attention-deficit hyperactivity disorder, unspecified type: Secondary | ICD-10-CM

## 2024-08-20 DIAGNOSIS — R7309 Other abnormal glucose: Secondary | ICD-10-CM | POA: Diagnosis not present

## 2024-08-20 DIAGNOSIS — R030 Elevated blood-pressure reading, without diagnosis of hypertension: Secondary | ICD-10-CM

## 2024-08-20 DIAGNOSIS — E66811 Obesity, class 1: Secondary | ICD-10-CM

## 2024-08-20 DIAGNOSIS — E6609 Other obesity due to excess calories: Secondary | ICD-10-CM

## 2024-08-20 LAB — LIPID PANEL
Cholesterol: 266 mg/dL — ABNORMAL HIGH (ref 0–200)
HDL: 81.8 mg/dL (ref >39.00)
LDL Cholesterol: 160 mg/dL — ABNORMAL HIGH (ref 0–99)
NonHDL: 183.9
Total CHOL/HDL Ratio: 3
Triglycerides: 122 mg/dL (ref 0.0–149.0)
VLDL: 24.4 mg/dL (ref 0.0–40.0)

## 2024-08-20 LAB — HEMOGLOBIN A1C: Hgb A1c MFr Bld: 5.3 % (ref 4.6–6.5)

## 2024-08-20 NOTE — Progress Notes (Signed)
 Patient ID: Becky Jensen, female    DOB: [birth year], 53 y.o.   MRN: [id number]   Assessment & Plan:  Adult ADHD  Dyslipidemia -     Lipid panel  Elevated hemoglobin A1c -     Hemoglobin A1c  Family history of colon cancer -     Ambulatory referral to Gastroenterology  Need for pneumococcal vaccine -     Pneumococcal conjugate vaccine 20-valent  Class 1 obesity due to excess calories without serious comorbidity with body mass index (BMI) of 33.0 to 33.9 in adult     Assessment & Plan Hyperlipidemia She has not been taking Lipitor due to achy feelings, even at night. Previous discontinuation for six months did not significantly alter cholesterol levels. - Order lipid panel - Consider resuming Lipitor if cholesterol levels are elevated - Discuss potential for CT cardiac score if cholesterol remains elevated and concerning  Elevated BP readings Blood pressure has been variable. She prefers lifestyle modifications over medication if possible. - Advise reduction of alcohol and processed foods - Encourage increased cardiovascular exercise  Pre-diabetes Previous A1c was 5.9. Anticipated improvement due to dietary changes and weight loss. - Order A1c test Lab Results  Component Value Date   HGBA1C 5.9 07/19/2023   HGBA1C 5.8 03/10/2022   HGBA1C 5.5 07/01/2021    Menopause Symptoms include mood swings and sleep disturbances. Nightly progesterone  has helped stabilize symptoms.  Attention-deficit hyperactivity disorder (ADHD) Previously on Vyvanse, which caused increased anxiety and hyperawareness. Currently on Wellbutrin, still in early stages of treatment. Follows with [facility].   Overweight Weight loss achieved through dietary changes and hydration. Previously on Zepbound , effective but discontinued due to cost. - Consider resuming Zepbound  in January if needed  General Health Maintenance Due for pneumonia vaccination and colonoscopy due to family  history of colon cancer. - Administer pneumonia vaccination - Resend referral for colonoscopy      Return in about 6 months (around 02/20/2025) for recheck/follow-up.    Subjective:    Chief Complaint  Patient presents with   Medical Management of Chronic Issues    Pt in office for follow up on chronic medical conditions; also to discuss Zepbound ; pt is fasting for labs as for CPE but mostly checking lipids; pt not taken Statin meds in 6 months or more; pt also needing to discuss Gastro referral for colon cancer screening; pt Cologuard Negative;     HPI Discussed the use of AI scribe software for clinical note transcription with the patient, who gave verbal consent to proceed.  History of Present Illness Becky Jensen is a 53 year old female who presents for medication management and blood work.  She has experienced significant weight loss over the summer, which she attributes to improved eating habits and hydration. Discontinuation of Vyvanse has helped her align more with her hunger cues. She is currently taking Wellbutrin, having stopped Vyvanse in early August due to increased anxiety and sleep disturbances. She has been on Wellbutrin for less than a month and is awaiting its full effects.  She has a history of using Zepbound  for six months, obtained through [employer], but stopped about a month ago due to financial constraints. While on Zepbound , she experienced significant weight loss and energy improvement.  Her perimenopausal symptoms, including mood swings and sleep disturbances, have improved since her last period over a year ago. She is on nightly progesterone , which she feels has stabilized her symptoms.  She has not been taking Lipitor  and is curious about her current cholesterol levels. Her cholesterol was significantly high before starting Lipitor, which then brought it down. She experiences achiness when taking Lipitor, even at night. Her blood pressure has been  variable, ranging from 122/80 to 145/90, and she is monitoring it to decide on further management.  She has a family history of colon cancer, with her brother having been diagnosed after his first colonoscopy. She had previously planned to use a Cologuard test but now intends to proceed with a full colonoscopy due to her family history.  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     Past Medical History:  Diagnosis Date   Depression    Dysmenorrhea    Hyperlipidemia    Infertility, female    Skin Cancer     Past Surgical History:  Procedure Laterality Date   ANKLE FRACTURE SURGERY Right 08/2018   FRACTURE SURGERY  September 2019   TONSILLECTOMY      Family History  Problem Relation Age of Onset   Hyperlipidemia Mother    Hypertension Mother    Transient ischemic attack Mother    Arthritis Mother    Stroke Mother    Hypertension Father    Obesity Father    ADD / ADHD Sister    Hyperlipidemia Sister    Cancer Brother 62 - 49       Colon: carcinoid neueroedocrine tumor   Hyperlipidemia Brother 30 - 39   Breast cancer Maternal Aunt 40   Cancer Maternal Aunt    Hyperlipidemia Maternal Aunt    Heart disease Maternal Uncle    Hyperlipidemia Maternal Uncle    Varicose Veins Paternal Aunt    Varicose Veins Paternal Aunt    Cancer Maternal Grandmother 3       stomach   Heart disease Maternal Grandfather    Alcohol abuse Maternal Grandfather    Cancer Paternal Grandmother        NH lymphoma   Hypertension Paternal Grandfather    Stroke Paternal Grandfather    Alcohol abuse Paternal Grandfather    Breast cancer Cousin     Social History   Tobacco Use   Smoking status: Former    Current packs/day: 0.00    Types: Cigarettes    Quit date: 06/09/2009    Years since quitting: 15.2   Smokeless tobacco: Never   Tobacco comments:    sporadic  Vaping Use   Vaping status: Never Used  Substance Use Topics   Alcohol use:  Yes    Alcohol/week: 11.0 standard drinks of alcohol    Types: 7 Glasses of wine, 4 Cans of beer per week    Comment: 10 drinks per week   Drug use: No     No Known Allergies  Review of Systems NEGATIVE UNLESS OTHERWISE INDICATED IN HPI      Objective:     BP 138/82 (BP Location: Left Arm, Patient Position: Sitting, Cuff Size: Normal)   Pulse 94   Temp 97.9 F (36.6 C) (Temporal)   Ht 5' 2 (1.575 m)   Wt 184 lb 9.6 oz (83.7 kg)   SpO2 98%   BMI 33.76 kg/m   Wt Readings from Last 3 Encounters:  08/20/24 184 lb 9.6 oz (83.7 kg)  03/18/24 199 lb 9.6 oz (90.5 kg)  09/05/23 202 lb (91.6 kg)    BP Readings from Last 3 Encounters:  08/20/24 138/82  03/18/24 138/86  09/05/23 130/78  Physical Exam Vitals and nursing note reviewed.  Constitutional:      Appearance: Normal appearance. She is obese.  Cardiovascular:     Rate and Rhythm: Normal rate and regular rhythm.  Pulmonary:     Effort: Pulmonary effort is normal.  Neurological:     General: No focal deficit present.     Mental Status: She is alert and oriented to person, place, and time.  Psychiatric:        Mood and Affect: Mood normal.        Behavior: Behavior normal.             Makena Mcgrady M Kaylenn Civil, PA-C

## 2024-08-20 NOTE — Patient Instructions (Addendum)
 Please call [facility] to schedule your colonoscopy. [phone]    VISIT SUMMARY: Today, we discussed your medication management, recent weight loss, and blood work. We also reviewed your cholesterol, blood pressure, and pre-diabetes status, as well as your menopausal symptoms and ADHD treatment. Additionally, we addressed your general health maintenance needs, including vaccinations and a colonoscopy.  YOUR PLAN: HYPERLIPIDEMIA: You have not been taking Lipitor due to achy feelings, even at night. Your cholesterol levels were previously high but improved with Lipitor. -We will order a lipid panel to check your current cholesterol levels. -If your cholesterol levels are elevated, we may consider resuming Lipitor. -We may discuss the potential for a CT cardiac score if your cholesterol remains elevated and concerning.  HYPERTENSION: Your blood pressure has been variable, and you prefer lifestyle modifications over medication. -Reduce alcohol and processed foods in your diet. -Increase your cardiovascular exercise.  PRE-DIABETES: Your previous A1c was 5.9, and we anticipate improvement due to your dietary changes and weight loss. -We will order an A1c test to check your current levels.  MENOPAUSE: You have experienced mood swings and sleep disturbances, which have been stabilized with nightly progesterone . -Continue taking nightly progesterone  to manage your symptoms.  ATTENTION-DEFICIT HYPERACTIVITY DISORDER (ADHD): You previously took Vyvanse, which caused increased anxiety. You are now on Wellbutrin and still in the early stages of treatment. -Continue taking Wellbutrin and monitor its effects.  OVERWEIGHT: You have achieved weight loss through dietary changes and hydration. You previously used Zepbound , which was effective but discontinued due to cost. -Consider resuming Zepbound  in January if needed.  GENERAL HEALTH MAINTENANCE: You are due for a pneumonia vaccination and a  colonoscopy due to your family history of colon cancer. -We will administer the pneumonia vaccination. -We will resend the referral for a colonoscopy.                      Contains text generated by [employer]bridge.                                 Contains text generated by [employer]bridge.

## 2024-08-21 ENCOUNTER — Other Ambulatory Visit: Payer: Self-pay

## 2024-08-21 DIAGNOSIS — E66812 Obesity, class 2: Secondary | ICD-10-CM

## 2024-08-21 DIAGNOSIS — E785 Hyperlipidemia, unspecified: Secondary | ICD-10-CM

## 2024-08-21 MED ORDER — ATORVASTATIN CALCIUM 40 MG PO TABS: 40.0000 mg | ORAL_TABLET | Freq: Every evening | ORAL | 3 refills | Status: AC

## 2024-09-02 ENCOUNTER — Other Ambulatory Visit: Payer: Self-pay

## 2024-09-02 ENCOUNTER — Encounter: Payer: Self-pay | Admitting: Physician Assistant

## 2024-09-02 MED ORDER — TIRZEPATIDE-WEIGHT MANAGEMENT 5 MG/0.5ML ~~LOC~~ SOLN: 5.0000 mg | SUBCUTANEOUS | 1 refills | Status: DC

## 2024-09-02 NOTE — Telephone Encounter (Signed)
 Please see pt msg and advise

## 2024-09-17 ENCOUNTER — Other Ambulatory Visit (HOSPITAL_COMMUNITY)
Admission: RE | Admit: 2024-09-17 | Discharge: 2024-09-17 | Disposition: A | Source: Ambulatory Visit | Attending: Radiology | Admitting: Radiology

## 2024-09-17 ENCOUNTER — Ambulatory Visit (INDEPENDENT_AMBULATORY_CARE_PROVIDER_SITE_OTHER): Admitting: Radiology

## 2024-09-17 ENCOUNTER — Encounter: Payer: Self-pay | Admitting: Radiology

## 2024-09-17 VITALS — BP 116/72 | HR 105 | Ht 61.0 in | Wt 181.0 lb

## 2024-09-17 DIAGNOSIS — Z1331 Encounter for screening for depression: Secondary | ICD-10-CM

## 2024-09-17 DIAGNOSIS — Z79890 Hormone replacement therapy (postmenopausal): Secondary | ICD-10-CM | POA: Diagnosis not present

## 2024-09-17 DIAGNOSIS — Z01419 Encounter for gynecological examination (general) (routine) without abnormal findings: Secondary | ICD-10-CM | POA: Insufficient documentation

## 2024-09-17 MED ORDER — ESTRADIOL 10 MCG VA TABS: 1.0000 | ORAL_TABLET | VAGINAL | 4 refills | Status: AC

## 2024-09-17 MED ORDER — ESTRADIOL 0.05 MG/24HR TD PTTW
1.0000 | MEDICATED_PATCH | TRANSDERMAL | 4 refills | Status: AC
Start: 2024-09-19 — End: ?

## 2024-09-17 MED ORDER — PROGESTERONE 200 MG PO CAPS
200.0000 mg | ORAL_CAPSULE | Freq: Every evening | ORAL | 4 refills | Status: AC
Start: 2024-09-17 — End: ?

## 2024-09-17 NOTE — Progress Notes (Signed)
 Becky Jensen 1971-11-15 [id number]   History:  53 y.o. G1P0 presents for annual exam. Doing well on HRT, has occasional hot flashes throughout the week, not sleeping great. C/o vaginal dryness with intercourse, is using a lubricant.   Gynecologic History No LMP recorded (exact date). Patient is postmenopausal.   Contraception/Family planning: none Sexually active: yes Last Pap: 2022. Results were: normal Last mammogram: 7/25. Results were: normal Colonoscopy: cologuard 2023 negative  Obstetric History OB History  Gravida Para Term Preterm AB Living  1 0 0 0 1 0  SAB IAB Ectopic Multiple Live Births  1 0 0 0     # Outcome Date GA Lbr Len/2nd Weight Sex Type Anes PTL Lv  1 SAB                09/17/2024    4:05 PM 08/20/2024    8:15 AM 07/26/2023   10:41 AM  Depression screen PHQ 2/9  Decreased Interest 0 0 0  Down, Depressed, Hopeless 0 0 0  PHQ - 2 Score 0 0 0  Altered sleeping   1  Tired, decreased energy   0  Change in appetite   1  Feeling bad or failure about yourself    0  Trouble concentrating   0  Moving slowly or fidgety/restless   0  Suicidal thoughts   0  PHQ-9 Score   2  Difficult doing work/chores   Somewhat difficult    The following portions of the patient's history were reviewed and updated as appropriate: allergies, current medications, past family history, past medical history, past social history, past surgical history, and problem list.  Review of Systems Pertinent items noted in HPI and remainder of comprehensive ROS otherwise negative.   Past medical history, past surgical history, family history and social history were all reviewed and documented in the EPIC chart.   Exam:  Vitals:   09/17/24 1603  BP: 116/72  Pulse: (!) 105  SpO2: 94%  Weight: 181 lb (82.1 kg)  Height: 5' 1 (1.549 m)    Body mass index is 34.2 kg/m.  General appearance:  Normal, obese Thyroid:  Symmetrical, normal in size, without palpable masses or  nodularity. Respiratory  Auscultation:  Clear without wheezing or rhonchi Cardiovascular  Auscultation:  Regular rate, without rubs, murmurs or gallops  Edema/varicosities:  Not grossly evident Abdominal  Soft,nontender, without masses, guarding or rebound.  Liver/spleen:  No organomegaly noted  Hernia:  None appreciated  Skin  Inspection:  Grossly normal Breasts: Examined lying and sitting.   Right: Without masses, retractions, nipple discharge or axillary adenopathy.   Left: Without masses, retractions, nipple discharge or axillary adenopathy. Genitourinary   Inguinal/mons:  Normal without inguinal adenopathy  External genitalia:  Normal appearing vulva with no masses, tenderness, or lesions  BUS/Urethra/Skene's glands:  Normal without masses or exudate  Vagina:  Normal appearing with normal color and discharge, no lesions. Mildly atrophic.  Cervix:  Normal appearing without discharge or lesions  Uterus:  Normal in size, shape and contour.  Mobile, nontender  Adnexa/parametria:     Rt: Normal in size, without masses or tenderness.   Lt: Normal in size, without masses or tenderness.  Anus and perineum: Normal   Becky Jensen, CMA present for exam  Assessment/Plan:   1. Well woman exam with routine gynecological exam (Primary) - Cytology - PAP( [facility])  2. Hormone replacement therapy (HRT) - progesterone  (PROMETRIUM ) 200 MG capsule; Take 1 capsule (200 mg total) by mouth  at bedtime.  Dispense: 90 capsule; Refill: 4 - estradiol  (VIVELLE -DOT) 0.05 MG/24HR patch; Place 1 patch (0.05 mg total) onto the skin 2 (two) times a week.  Dispense: 24 patch; Refill: 4 - Estradiol  10 MCG TABS vaginal tablet; Place 1 tablet (10 mcg total) vaginally 2 (two) times a week.  Dispense: 24 tablet; Refill: 4  3. Depression screen   Return in 1 year for annual or as needed.   Becky Jensen B WHNP-BC 4:07 PM 09/17/2024

## 2024-09-19 ENCOUNTER — Ambulatory Visit: Payer: Self-pay | Admitting: Radiology

## 2024-09-19 LAB — CYTOLOGY - PAP
Adequacy: ABSENT
Comment: NEGATIVE
Diagnosis: NEGATIVE
High risk HPV: NEGATIVE

## 2024-10-18 ENCOUNTER — Other Ambulatory Visit: Payer: Self-pay | Admitting: Physician Assistant

## 2024-12-23 ENCOUNTER — Telehealth: Admitting: Physician Assistant

## 2024-12-23 DIAGNOSIS — J019 Acute sinusitis, unspecified: Secondary | ICD-10-CM | POA: Diagnosis not present

## 2024-12-23 DIAGNOSIS — B9689 Other specified bacterial agents as the cause of diseases classified elsewhere: Secondary | ICD-10-CM

## 2024-12-23 MED ORDER — AMOXICILLIN-POT CLAVULANATE 875-125 MG PO TABS: 1.0000 | ORAL_TABLET | Freq: Two times a day (BID) | ORAL | 0 refills | Status: AC

## 2024-12-23 NOTE — Progress Notes (Signed)

## 2025-01-13 ENCOUNTER — Encounter: Payer: Self-pay | Admitting: Gastroenterology

## 2025-01-27 ENCOUNTER — Ambulatory Visit

## 2025-01-27 VITALS — Ht 62.0 in | Wt 190.0 lb

## 2025-01-27 DIAGNOSIS — Z1211 Encounter for screening for malignant neoplasm of colon: Secondary | ICD-10-CM

## 2025-01-27 MED ORDER — NA SULFATE-K SULFATE-MG SULF 17.5-3.13-1.6 GM/177ML PO SOLN: 1.0000 | Freq: Once | ORAL | 0 refills | Status: AC

## 2025-01-27 NOTE — Progress Notes (Signed)

## 2025-01-28 ENCOUNTER — Encounter: Payer: Self-pay | Admitting: Gastroenterology

## 2025-01-29 ENCOUNTER — Other Ambulatory Visit: Payer: Self-pay | Admitting: Physician Assistant

## 2025-02-10 ENCOUNTER — Encounter: Admitting: Gastroenterology

## 2025-02-25 ENCOUNTER — Ambulatory Visit: Admitting: Physician Assistant
